# Patient Record
Sex: Female | Born: 1955 | Race: Black or African American | Hispanic: No | Smoking: Never smoker
Health system: Southern US, Community
[De-identification: ages and names within clinical notes are randomized; demographics above are authoritative.]

## PROBLEM LIST (undated history)

## (undated) DIAGNOSIS — R7989 Other specified abnormal findings of blood chemistry: Secondary | ICD-10-CM

## (undated) DIAGNOSIS — Z1159 Encounter for screening for other viral diseases: Secondary | ICD-10-CM

## (undated) DIAGNOSIS — M1990 Unspecified osteoarthritis, unspecified site: Secondary | ICD-10-CM

## (undated) DIAGNOSIS — E669 Obesity, unspecified: Secondary | ICD-10-CM

## (undated) DIAGNOSIS — L918 Other hypertrophic disorders of the skin: Secondary | ICD-10-CM

## (undated) DIAGNOSIS — E876 Hypokalemia: Secondary | ICD-10-CM

## (undated) DIAGNOSIS — I10 Essential (primary) hypertension: Secondary | ICD-10-CM

## (undated) DIAGNOSIS — R8290 Unspecified abnormal findings in urine: Secondary | ICD-10-CM

## (undated) HISTORY — DX: Other hypertrophic disorders of the skin: L91.8

## (undated) HISTORY — DX: Unspecified osteoarthritis, unspecified site: M19.90

## (undated) HISTORY — DX: Unspecified abnormal findings in urine: R82.90

## (undated) HISTORY — PX: PARTIAL HYSTERECTOMY: SHX80

## (undated) HISTORY — DX: Obesity, unspecified: E66.9

## (undated) HISTORY — DX: Hypokalemia: E87.6

## (undated) HISTORY — DX: Essential (primary) hypertension: I10

## (undated) HISTORY — PX: TUBAL LIGATION: SHX77

## (undated) HISTORY — DX: Other specified abnormal findings of blood chemistry: R79.89

## (undated) HISTORY — DX: Encounter for screening for other viral diseases: Z11.59

---

## 1999-04-29 ENCOUNTER — Other Ambulatory Visit: Admission: RE | Admit: 1999-04-29 | Discharge: 1999-04-29 | Payer: Self-pay | Admitting: Gynecology

## 2000-05-09 ENCOUNTER — Other Ambulatory Visit: Admission: RE | Admit: 2000-05-09 | Discharge: 2000-05-09 | Payer: Self-pay | Admitting: Gynecology

## 2000-11-27 ENCOUNTER — Ambulatory Visit (HOSPITAL_COMMUNITY): Admission: RE | Admit: 2000-11-27 | Discharge: 2000-11-27 | Payer: Self-pay | Admitting: Gynecology

## 2001-01-16 ENCOUNTER — Encounter (INDEPENDENT_AMBULATORY_CARE_PROVIDER_SITE_OTHER): Payer: Self-pay

## 2001-01-16 ENCOUNTER — Ambulatory Visit (HOSPITAL_COMMUNITY): Admission: RE | Admit: 2001-01-16 | Discharge: 2001-01-16 | Payer: Self-pay | Admitting: Gynecology

## 2009-01-22 ENCOUNTER — Ambulatory Visit: Payer: Self-pay | Admitting: Family Medicine

## 2009-01-22 DIAGNOSIS — I10 Essential (primary) hypertension: Secondary | ICD-10-CM | POA: Insufficient documentation

## 2009-01-26 LAB — CONVERTED CEMR LAB
ALT: 15 units/L (ref 0–35)
AST: 23 units/L (ref 0–37)
Basophils Relative: 0.6 % (ref 0.0–3.0)
CO2: 31 meq/L (ref 19–32)
Calcium: 9.2 mg/dL (ref 8.4–10.5)
Chloride: 102 meq/L (ref 96–112)
Eosinophils Relative: 3.7 % (ref 0.0–5.0)
GFR calc non Af Amer: 84.13 mL/min (ref >60)
HCT: 40.7 % (ref 36.0–46.0)
Hemoglobin: 13.7 g/dL (ref 12.0–15.0)
Lymphs Abs: 1.8 10*3/uL (ref 0.7–4.0)
MCV: 94.3 fL (ref 78.0–100.0)
Monocytes Relative: 8 % (ref 3.0–12.0)
Neutro Abs: 2.4 10*3/uL (ref 1.4–7.7)
RBC: 4.32 M/uL (ref 3.87–5.11)
Sodium: 140 meq/L (ref 135–145)
Total Bilirubin: 0.8 mg/dL (ref 0.3–1.2)
Total CHOL/HDL Ratio: 3
Total Protein: 7.4 g/dL (ref 6.0–8.3)
WBC: 4.8 10*3/uL (ref 4.5–10.5)

## 2009-02-23 ENCOUNTER — Ambulatory Visit: Payer: Self-pay | Admitting: Family Medicine

## 2009-02-25 ENCOUNTER — Encounter (INDEPENDENT_AMBULATORY_CARE_PROVIDER_SITE_OTHER): Payer: Self-pay | Admitting: *Deleted

## 2009-02-25 LAB — CONVERTED CEMR LAB
Calcium: 9.5 mg/dL (ref 8.4–10.5)
Creatinine, Ser: 1.2 mg/dL (ref 0.4–1.2)
Glucose, Bld: 86 mg/dL (ref 70–99)
Phosphorus: 3.5 mg/dL (ref 2.3–4.6)
Sodium: 141 meq/L (ref 135–145)

## 2009-02-27 ENCOUNTER — Ambulatory Visit: Payer: Self-pay | Admitting: Family Medicine

## 2009-02-27 ENCOUNTER — Encounter: Payer: Self-pay | Admitting: Family Medicine

## 2009-03-06 ENCOUNTER — Encounter (INDEPENDENT_AMBULATORY_CARE_PROVIDER_SITE_OTHER): Payer: Self-pay | Admitting: *Deleted

## 2009-03-06 LAB — HM MAMMOGRAPHY: HM Mammogram: NORMAL

## 2009-03-12 ENCOUNTER — Ambulatory Visit: Payer: Self-pay | Admitting: Family Medicine

## 2009-03-12 DIAGNOSIS — E876 Hypokalemia: Secondary | ICD-10-CM | POA: Insufficient documentation

## 2009-03-13 LAB — CONVERTED CEMR LAB: Potassium: 3.5 meq/L (ref 3.5–5.1)

## 2009-08-24 ENCOUNTER — Ambulatory Visit: Payer: Self-pay | Admitting: Family Medicine

## 2009-09-29 ENCOUNTER — Ambulatory Visit: Payer: Self-pay | Admitting: Family Medicine

## 2009-09-30 LAB — CONVERTED CEMR LAB
ALT: 23 units/L (ref 0–35)
AST: 27 units/L (ref 0–37)
Basophils Absolute: 0 10*3/uL (ref 0.0–0.1)
Calcium: 9.3 mg/dL (ref 8.4–10.5)
Glucose, Bld: 67 mg/dL — ABNORMAL LOW (ref 70–99)
LDL Cholesterol: 112 mg/dL — ABNORMAL HIGH (ref 0–99)
Lymphocytes Relative: 38.6 % (ref 12.0–46.0)
Monocytes Relative: 7 % (ref 3.0–12.0)
Phosphorus: 3.5 mg/dL (ref 2.3–4.6)
Platelets: 209 10*3/uL (ref 150.0–400.0)
Potassium: 3.6 meq/L (ref 3.5–5.1)
RDW: 13.6 % (ref 11.5–14.6)
Sodium: 143 meq/L (ref 135–145)
TSH: 2.57 microintl units/mL (ref 0.35–5.50)
VLDL: 12.4 mg/dL (ref 0.0–40.0)

## 2010-02-22 ENCOUNTER — Ambulatory Visit
Admission: RE | Admit: 2010-02-22 | Discharge: 2010-02-22 | Payer: Self-pay | Source: Home / Self Care | Attending: Family Medicine | Admitting: Family Medicine

## 2010-03-02 NOTE — Assessment & Plan Note (Signed)
Summary: 6 MONTH FOLLOW UP/RBH   Vital Signs:  Patient profile:   55 year old female Height:      63.25 inches Weight:      158.75 pounds BMI:     28.00 Temp:     98.9 degrees F oral Pulse rate:   84 / minute Pulse rhythm:   regular BP sitting:   168 / 94  (left arm) Cuff size:   regular  Vitals Entered By: Lewanda Rife LPN (August 24, 2009 9:30 AM) CC: six month f/u Comments Pt has been out of BP med for one week.   History of Present Illness: here for 6 mo f/u of HTN and hypokalemia   bp is 168/94- off med for one week (since she ran out) is normally on zestoretic   has been doing fine overall  feeling ok with no problems and taking good care of herself great diet and exercise   is eating more fiber too  started walking too - loves it   is out of K too     Allergies (verified): No Known Drug Allergies  Past History:  Past Surgical History: Last updated: 01/22/2009 partial hysterect - for cyst , has one ovary remaining (no hx of abn pap smears) BTL   Family History: Last updated: 01/22/2009 mother HTN  father asthma , copd -- cigars , lung cancer  sister with breast cancer  sister with DM - mild  ? heart problems in  distant family  some ? mental illness in family   Social History: Last updated: 01/22/2009 single  12 grade education G1p1 works at [employer] -- 3rd shift  never smoked  no alcohol  walks for exercise  cares for new grandchild   Past Medical History: HTN  hypokalemia   Review of Systems General:  Denies fatigue, loss of appetite, and malaise. Eyes:  Denies blurring and eye irritation. CV:  Denies chest pain or discomfort, lightheadness, palpitations, and shortness of breath with exertion. Resp:  Denies cough, shortness of breath, and wheezing. GI:  Denies abdominal pain, change in bowel habits, indigestion, and nausea. GU:  Denies urinary frequency. Derm:  Denies itching, lesion(s), poor wound healing, and rash. Neuro:   Denies headaches, numbness, and tingling. Endo:  Denies cold intolerance, excessive thirst, excessive urination, and heat intolerance.  Physical Exam  General:  Well-developed,well-nourished,in no acute distress; alert,appropriate and cooperative throughout examination Head:  normocephalic, atraumatic, and no abnormalities observed.   Eyes:  vision grossly intact, pupils equal, pupils round, and pupils reactive to light.  no conjunctival pallor, injection or icterus  Mouth:  pharynx pink and moist.   Neck:  supple with full rom and no masses or thyromegally, no JVD or carotid bruit  Chest Wall:  No deformities, masses, or tenderness noted. Lungs:  Normal respiratory effort, chest expands symmetrically. Lungs are clear to auscultation, no crackles or wheezes. Heart:  Normal rate and regular rhythm. S1 and S2 normal without gallop, murmur, click, rub or other extra sounds. Abdomen:  Bowel sounds positive,abdomen soft and non-tender without masses, organomegaly or hernias noted. no renal bruits  Msk:  No deformity or scoliosis noted of thoracic or lumbar spine.  no acute joint changes  Pulses:  R and L carotid,radial,femoral,dorsalis pedis and posterior tibial pulses are full and equal bilaterally Extremities:  No clubbing, cyanosis, edema, or deformity noted with normal full range of motion of all joints.   Neurologic:  sensation intact to light touch, gait normal, and DTRs symmetrical  and normal.   Skin:  Intact without suspicious lesions or rashes Cervical Nodes:  No lymphadenopathy noted Psych:  normal affect, talkative and pleasant    Impression & Recommendations:  Problem # 1:  HYPERTENSION, BENIGN ESSENTIAL (ICD-401.1) Assessment Deteriorated  this is up off med  refilled that-per pt was well controlled explained she can call in future if she runs out early  sched lab and nurse bp check in 1 mo  if all ok - will f/u with me in 6 mo  disc healthy diet (low simple sugar/ choose  complex carbs/ low sat fat) diet and exercise in detail  Her updated medication list for this problem includes:    Zestoretic 10-12.5 Mg Tabs (Lisinopril-hydrochlorothiazide) .Marland Kitchen... 1 by mouth once daily  BP today: 168/94 Prior BP: 120/78 (02/23/2009)  Labs Reviewed: K+: 3.5 (03/12/2009) Creat: : 1.2 (02/23/2009)   Chol: 167 (01/22/2009)   HDL: 66.50 (01/22/2009)   LDL: 84 (01/22/2009)   TG: 84.0 (01/22/2009)  Problem # 2:  HYPOKALEMIA (ICD-276.8) Assessment: Unchanged this imp with meds get back on low dose K - sent to pharmacy and check labs for HTN incl K in 1 mo  f/u 6 mo  disc high K foods  Complete Medication List: 1)  Zestoretic 10-12.5 Mg Tabs (Lisinopril-hydrochlorothiazide) .Marland Kitchen.. 1 by mouth once daily 2)  Potassium Chloride Cr 10 Meq Cr-caps (Potassium chloride) .... Take one by mouth daily 3)  Multivitamins Tabs (Multiple vitamin) .... Take 1 tablet by mouth once a day  Patient Instructions: 1)  schedule fasting labs in about a month lipid/ast/alt/renal /tsh/ cbc with diff 401.1- and please do nurse visit that day for blood pressure as well  2)  get back on your medicines - make sure to take them the day of your blood draw  3)  follow up with me in 6 months  Prescriptions: POTASSIUM CHLORIDE CR 10 MEQ CR-CAPS (POTASSIUM CHLORIDE) take one by mouth daily  #30 x 11   Entered and Authorized by:   Judith Part MD   Signed by:   Judith Part MD on 08/24/2009   Method used:   Electronically to        [facility] [address] (retail)       [address], [address]       [place], [place]  [address]       Ph: [phone]       Fax: [phone]   RxID:   [id number] ZESTORETIC 10-12.5 MG TABS (LISINOPRIL-HYDROCHLOROTHIAZIDE) 1 by mouth once daily  #30 x 11   Entered and Authorized by:   Judith Part MD   Signed by:   Judith Part MD on 08/24/2009   Method used:   Electronically to        [facility] [address] (retail)        [address], [address]       [place], [place]  [address]       Ph: [phone]       Fax: [phone]   RxID:   [id number]   Current Allergies (reviewed today): No known allergies

## 2010-03-02 NOTE — Assessment & Plan Note (Signed)
Summary: TOWER BP CHECK/RBH  Nurse Visit   Vital Signs:  Patient profile:   55 year old female Temp:     98.1 degrees F oral Pulse rate:   84 / minute Pulse rhythm:   regular BP sitting:   110 / 70  (right arm)  Vitals Entered By: Sydell Axon LPN (September 29, 2009 8:59 AM) CC: Patient here today for blood pressure check. Patient has been compliant with medications. No other complaints noted.   Allergies: No Known Drug Allergies  Orders Added: 1)  Est. Patient Level I [62703]  Current Allergies (reviewed today): No known allergies  blood pressure is good! she will f/u in 6 mo as planned

## 2010-03-02 NOTE — Miscellaneous (Signed)
Summary: mammogram results  Clinical Lists Changes  Observations: Added new observation of MAMMO DUE: 03/2010 (03/06/2009 9:17) Added new observation of MAMMOGRAM: normal (03/06/2009 9:17)      Preventive Care Screening  Mammogram:    Date:  03/06/2009    Next Due:  03/2010    Results:  normal

## 2010-03-02 NOTE — Assessment & Plan Note (Signed)
Summary: ONE MTH F/U FOR HIGH BLOOD PRESSURE / LFW   Vital Signs:  Patient profile:   55 year old female Weight:      157 pounds BMI:     27.69 Temp:     98 degrees F oral Pulse rate:   76 / minute Pulse rhythm:   regular BP sitting:   120 / 78  (left arm) Cuff size:   regular  Vitals Entered By: Lowella Petties CMA (February 23, 2009 9:22 AM) CC: One month follow up for BP   History of Present Illness: here for f/u of HTN  on zestoretic -- no side eff or problems at all   bp improved today at 120/78  wt is down 4 lb- is not eating any differently  is not a big eater , but is drinking more water-- this helps   K was 3.4 at last check- recommended high K foods  is eating a lot of bananas   Last Lipid ProfileCholesterol: 167 (01/22/2009 11:54:39 AM)HDL:  66.50 (01/22/2009 11:54:39 AM)LDL:  84 (01/22/2009 11:54:39 AM)Triglycerides:  Last Liver profileSGOT:  23 (01/22/2009 11:54:39 AM)SPGT:  15 (01/22/2009 11:54:39 AM)T. Bili:  0.8 (01/22/2009 11:54:39 AM)Alk Phos:  49 (01/22/2009 11:54:39 AM)  overall very good with high hdl and low LDL diet is good     Allergies: No Known Drug Allergies  Past History:  Past Medical History: Last updated: 01/22/2009 HTN   Past Surgical History: Last updated: 01/22/2009 partial hysterect - for cyst , has one ovary remaining (no hx of abn pap smears) BTL   Family History: Last updated: 01/22/2009 mother HTN  father asthma , copd -- cigars , lung cancer  sister with breast cancer  sister with DM - mild  ? heart problems in  distant family  some ? mental illness in family   Social History: Last updated: 01/22/2009 single  12 grade education G1p1 works at [employer] -- 3rd shift  never smoked  no alcohol  walks for exercise  cares for new grandchild   Review of Systems General:  Denies fatigue, loss of appetite, and malaise. Eyes:  Denies blurring and eye pain. CV:  Denies chest pain or discomfort,  lightheadness, palpitations, shortness of breath with exertion, and swelling of feet. Resp:  Denies cough and wheezing. GI:  Denies abdominal pain, change in bowel habits, and indigestion. GU:  Denies urinary frequency. MS:  Denies cramps and stiffness. Derm:  Denies lesion(s), poor wound healing, and rash. Neuro:  Denies numbness and tingling. Endo:  Denies cold intolerance, excessive thirst, excessive urination, and heat intolerance.  Physical Exam  General:  Well-developed,well-nourished,in no acute distress; alert,appropriate and cooperative throughout examination Head:  normocephalic, atraumatic, and no abnormalities observed.   Eyes:  vision grossly intact, pupils equal, pupils round, and pupils reactive to light.  no conjunctival pallor, injection or icterus  Mouth:  pharynx pink and moist.   Neck:  supple with full rom and no masses or thyromegally, no JVD or carotid bruit  Lungs:  Normal respiratory effort, chest expands symmetrically. Lungs are clear to auscultation, no crackles or wheezes. Heart:  Normal rate and regular rhythm. S1 and S2 normal without gallop, murmur, click, rub or other extra sounds. Extremities:  No clubbing, cyanosis, edema, or deformity noted with normal full range of motion of all joints.   Neurologic:  sensation intact to light touch, gait normal, and DTRs symmetrical and normal.   Skin:  Intact without suspicious lesions or rashes Cervical Nodes:  No lymphadenopathy noted Psych:  normal affect, talkative and pleasant    Impression & Recommendations:  Problem # 1:  HYPERTENSION, BENIGN ESSENTIAL (ICD-401.1) Assessment Improved  much imp with zestroetic - tolerating well disc healthy diet (low simple sugar/ choose complex carbs/ low sat fat) diet and exercise in detail -- also avoiding sodium  commended on good water intake lab for renal prof today- will suppl K if needed  f/u 6 mo  Her updated medication list for this problem includes:     Zestoretic 10-12.5 Mg Tabs (Lisinopril-hydrochlorothiazide) .Marland Kitchen... 1 by mouth once daily  Orders: Venipuncture (16109) TLB-Renal Function Panel (80069-RENAL)  BP today: 120/78 Prior BP: 150/98 (01/22/2009)  Labs Reviewed: K+: 3.4 (01/22/2009) Creat: : 0.9 (01/22/2009)   Chol: 167 (01/22/2009)   HDL: 66.50 (01/22/2009)   LDL: 84 (01/22/2009)   TG: 84.0 (01/22/2009)  Complete Medication List: 1)  Zestoretic 10-12.5 Mg Tabs (Lisinopril-hydrochlorothiazide) .Marland Kitchen.. 1 by mouth once daily  Patient Instructions: 1)  blood pressure is very good  2)  no change in medicine 3)  keep up good fruit and vegetable and water intake  4)  follow up in 6 months  5)  labs today Prescriptions: ZESTORETIC 10-12.5 MG TABS (LISINOPRIL-HYDROCHLOROTHIAZIDE) 1 by mouth once daily  #30 x 11   Entered and Authorized by:   Judith Part MD   Signed by:   Judith Part MD on 02/23/2009   Method used:   Print then Give to Patient   RxID:   [id number]   Prior Medications (reviewed today): ZESTORETIC 10-12.5 MG TABS (LISINOPRIL-HYDROCHLOROTHIAZIDE) 1 by mouth once daily Current Allergies: No known allergies

## 2010-03-02 NOTE — Miscellaneous (Signed)
Summary: med list update- potassium  Clinical Lists Changes  Medications: Added new medication of POTASSIUM CHLORIDE CR 10 MEQ CR-CAPS (POTASSIUM CHLORIDE) take one by mouth daily     Prior Medications: ZESTORETIC 10-12.5 MG TABS (LISINOPRIL-HYDROCHLOROTHIAZIDE) 1 by mouth once daily Current Allergies: No known allergies

## 2010-03-10 NOTE — Assessment & Plan Note (Signed)
Summary: 6 MONTH FOLLOW UP/RBH   Vital Signs:  Patient profile:   55 year old female Height:      63.25 inches Weight:      161.25 pounds BMI:     28.44 Temp:     98.5 degrees F oral Pulse rate:   76 / minute Pulse rhythm:   regular BP sitting:   144 / 86  (left arm) Cuff size:   regular  Vitals Entered By: Lewanda Rife LPN (February 22, 2010 9:09 AM)  Serial Vital Signs/Assessments:  Time      Position  BP       Pulse  Resp  Temp     By                     132/85                         Judith Part MD  CC: six month f/u   History of Present Illness: here for 6 mo f/u of HTN   wt is up 3lb with bmi of 28  first bp today is 144/86 did take med at 6:30 this am  has been feeling ok overall  is eating healthy and taking care of herself-- walks every day and loves it    on zestoretic - no problems or side eff   lipids up a bit Last Lipid ProfileCholesterol: 180 (09/29/2009 8:50:29 AM)HDL:  55.20 (09/29/2009 8:50:29 AM)LDL:  112 (09/29/2009 8:50:29 AM)Triglycerides:  Last Liver profileSGOT:  27 (09/29/2009 8:50:29 AM)SPGT:  23 (09/29/2009 8:50:29 AM)T. Bili:  0.8 (01/22/2009 11:54:39 AM)Alk Phos:  49 (01/22/2009 11:54:39 AM)  LDL used to be 84  eating has not changed - even for holidays  low fat diet    at work for [employer] -- bp was 118/72 at her physical there    Allergies (verified): No Known Drug Allergies  Past History:  Past Medical History: Last updated: 08/24/2009 HTN  hypokalemia   Past Surgical History: Last updated: 01/22/2009 partial hysterect - for cyst , has one ovary remaining (no hx of abn pap smears) BTL   Family History: Last updated: 01/22/2009 mother HTN  father asthma , copd -- cigars , lung cancer  sister with breast cancer  sister with DM - mild  ? heart problems in  distant family  some ? mental illness in family   Social History: Last updated: 01/22/2009 single  12 grade education G1p1 works at [employer] --  3rd shift  never smoked  no alcohol  walks for exercise  cares for new grandchild   Review of Systems General:  Denies fatigue, fever, loss of appetite, and malaise. Eyes:  Denies blurring and eye irritation. CV:  Denies chest pain or discomfort, palpitations, shortness of breath with exertion, and swelling of feet. Resp:  Denies cough and shortness of breath. GI:  Denies abdominal pain, bloody stools, change in bowel habits, and nausea. MS:  Denies joint pain, joint redness, joint swelling, muscle aches, and cramps. Derm:  Denies itching, lesion(s), poor wound healing, and rash. Neuro:  Denies numbness and tingling. Psych:  mood is good . Endo:  Denies cold intolerance, excessive thirst, excessive urination, and heat intolerance. Heme:  Denies abnormal bruising and bleeding.  Physical Exam  General:  Well-developed,well-nourished,in no acute distress; alert,appropriate and cooperative throughout examination Head:  normocephalic, atraumatic, and no abnormalities observed.   Eyes:  vision grossly intact, pupils equal, pupils round,  and pupils reactive to light.   Mouth:  pharynx pink and moist.   Neck:  supple with full rom and no masses or thyromegally, no JVD or carotid bruit  Lungs:  Normal respiratory effort, chest expands symmetrically. Lungs are clear to auscultation, no crackles or wheezes. Heart:  Normal rate and regular rhythm. S1 and S2 normal without gallop, murmur, click, rub or other extra sounds. Msk:  No deformity or scoliosis noted of thoracic or lumbar spine.  no acute joint changes  Pulses:  R and L carotid,radial,femoral,dorsalis pedis and posterior tibial pulses are full and equal bilaterally Extremities:  No clubbing, cyanosis, edema, or deformity noted with normal full range of motion of all joints.   Neurologic:  sensation intact to light touch, gait normal, and DTRs symmetrical and normal.   Skin:  Intact without suspicious lesions or rashes Cervical Nodes:  No  lymphadenopathy noted Psych:  normal affect, talkative and pleasant    Impression & Recommendations:  Problem # 1:  HYPERTENSION, BENIGN ESSENTIAL (ICD-401.1) Assessment Unchanged  doing well with no problems  re check is better enc good lifestyle habits  and continued exercise  Her updated medication list for this problem includes:    Zestoretic 10-12.5 Mg Tabs (Lisinopril-hydrochlorothiazide) .Marland Kitchen... 1 by mouth once daily  BP today: 144/86-- re check 132/85 Prior BP: 110/70 (09/29/2009)  Labs Reviewed: K+: 3.6 (09/29/2009) Creat: : 1.1 (09/29/2009)   Chol: 180 (09/29/2009)   HDL: 55.20 (09/29/2009)   LDL: 112 (09/29/2009)   TG: 62.0 (09/29/2009)  Complete Medication List: 1)  Zestoretic 10-12.5 Mg Tabs (Lisinopril-hydrochlorothiazide) .Marland Kitchen.. 1 by mouth once daily 2)  Potassium Chloride Cr 10 Meq Cr-caps (Potassium chloride) .... Take one by mouth daily 3)  Multivitamins Tabs (Multiple vitamin) .... Take 1 tablet by mouth once a day  Patient Instructions: 1)  f/u in 6 months for check up - labs prior  2)  no change in med    Orders Added: 1)  Est. Patient Level III [24401]    Current Allergies (reviewed today): No known allergies

## 2010-06-18 NOTE — H&P (Signed)
[facility]  Patient:    MARYUM, BATTERSON Visit Number: [id number] MRN: [id number]          Service Type: DSU Location: [facility] Attending Physician:  Merrily Pew Dictated by:   Nadyne Coombes Fontaine, M.D. Admit Date:  11/28/2000                           History and Physical  CHIEF COMPLAINT:              Increasing dysmenorrhea.  HISTORY OF PRESENT ILLNESS:   This is a 55 year old G2 P1 AB1 female, status post tubal sterilization, with history of worsening dysmenorrhea.  The patient was scheduled for admission in October 2002 for surgery and subsequently was cancelled due to elevated blood pressures in the preoperative holding area. She subsequently has been followed by Urgent Care medical and has now normalized blood pressures on medication, measuring 110/76.  She is admitted at this time for surgery.  Please see her October 2002 dictation for full History and Physical.  Correction to the dictation includes (otherwise, dictation is the same):  PAST MEDICAL HISTORY:         Hypertension.  CURRENT MEDICATIONS:          1. Toprol XL 50 mg q.d.                               2. Maxzide 25 mg q.d. Dictated by:   Nadyne Coombes. Fontaine, M.D. Attending Physician:  Merrily Pew DD:  01/15/01 TD:  01/16/01 Job: [phone] [id number]

## 2010-06-18 NOTE — H&P (Signed)
[facility]  Patient:    Kristin Barton, Kristin Barton Visit Number: [id number] MRN: [id number]          Service Type: Attending:  Nadyne Coombes. Fontaine, M.D. Dictated by:   Nadyne Coombes. Fontaine, M.D. Adm. Date:  11/08/00                           History and Physical  CHIEF COMPLAINT:              Increasing dysmenorrhea.  HISTORY OF PRESENT ILLNESS:   This is a 55 year old, G2, P1, Ab1, female, status post tubal sterilization who presents with worsening dysmenorrhea. The patients outpatient evaluation included an ultrasound which shows a complex cyst with inhomogeneous internal echoes measuring 3 cm on the left ovary which has persisted over a several month observation. She has a normal CA-125 and the preoperative diagnosis is endometriosis. The patient does have a history of laparoscopic tubal sterilization in 1996 at which time both adnexa were adhered to the posterior cul-de-sac with obliteration of the cul-de-sac, although no active endometriosis was visualized at that time.  PAST MEDICAL HISTORY:         Uncomplicated.  PAST SURGICAL HISTORY:        Cesarean section, TAB, laparoscopic tubal sterilization, laser vaporization of perineal condylomata.  ALLERGIES:                    No known drug allergies.  CURRENT MEDICATIONS:          None.  REVIEW OF SYSTEMS:            Noncontributory.  FAMILY HISTORY:               Noncontributory.  SOCIAL HISTORY:               Noncontributory.  ADMISSION PHYSICAL EXAMINATION:  VITAL SIGNS:                  Afebrile, vital signs are stable.  HEENT:                        Normal.  LUNGS:                        Clear.  CARDIAC:                      Regular rate without rubs, murmurs, or gallops.  ABDOMEN:                      Benign.  PELVIC:                       External BUS and vagina normal. Cervix normal. Uterus slightly irregular. Adnexa without masses or tenderness.  ASSESSMENT:                    38. A 55 year old G2, P38, Ab1 female, status post                                  laparoscopic tubal sterilization with                                  increasing dysmenorrhea.  2. Persistent complex cystic mass suggestive of                                  endometrioma, normal CA-125, for                                  laparoscopic evaluation.  DISCUSSION:                   I reviewed with the patient the situation and alternatives to include the options for expectant management given that the cystic area on her ovary has not changed over several month observation versus a laparoscopy. The patient understands given the adhesive disease that I realistically do not anticipate I am going to be able to free up her cul-de-sac or do major reconstructive surgery, but my goal is to assess the left ovary for an endometrioma and to assess for any other evidence of endometriosis or significant pathology that I can safely address. If I feel that during the procedure that I cannot safely address these areas due to significant adhesive disease, then will terminate the procedure. Alternatives to include hysterectomy were also reviewed with her and she feels most comfortable with a conservative laparoscopic approach. She understands the intraoperative, postoperative courses, multiple trocar placements, insufflation, sharp and blunt dissection, electrocautery, and the use of laser. The potential for inadvertent injury to internal organs, including bowel, bladder, ureters, vessels and nerves necessitating major exploratory reparative surgeries and future reparative surgeries, including ostomy formation, was all discussed, understood, and accepted. The risks of infection requiring prolonged antibiotics as well as opening and draining of the incisions or abscess formations was all discussed with her. The risks of major hemorrhage necessitating transfusion and the risk of  transfusion including HIV were all discussed, understood, and accepted. No guarantees were made as far as pain relief is concerned. She understands that her pain may persist, worsen, or change after the procedure, although it certainly may improve and we will have to wait and see following the procedure. The patient is comfortable with the plan. Her questions were answered and she is ready to proceed with surgery. Dictated by:   Nadyne Coombes. Fontaine, M.D. Attending:  Nadyne Coombes. Fontaine, M.D. DD:  11/27/00 TD:  11/27/00 Job: [id number]

## 2010-06-18 NOTE — Op Note (Signed)
[facility]  Patient:    Kristin Barton, Kristin Barton Visit Number: [id number] MRN: [id number]          Service Type: DSU Location: [facility] Attending Physician:  Merrily Pew Dictated by:   Nadyne Coombes Fontaine, M.D. Proc. Date: 01/16/01 Admit Date:  01/16/2001                             Operative Report  PREOPERATIVE DIAGNOSES:       1. Pain.                               2. Left ovarian cyst.                               3. Pelvic adhesive disease.  POSTOPERATIVE DIAGNOSES:      1. Pain.                               2. Left ovarian cyst.                               3. Pelvic adhesive disease.  PROCEDURE:                    Laparoscopic left ovarian cystectomy.  SURGEON:                      Timothy P. Fontaine, M.D.  ANESTHESIA:                   General endotracheal.  COMPLICATIONS:                None.  ESTIMATED BLOOD LOSS:         Minimal.  SPECIMENS:                    Left ovarian cyst wall.  FINDINGS:                     Anterior cul-de-sac normal.  Posterior cul-de-sac obliterated by both ovaries adherent into the posterior cul-de-sac. Uterus grossly normal in size.  Right ovary grossly normal, adherent to the cul-de-sac.  Left ovary:  A 2 cm simple cyst, excised.  Ovary otherwise adherent to the cul-de-sac.  Proximal fallopian tube segments approximately 0.5 cm bilaterally consistent with prior tubal sterilization.  Distal segments not visualized.  No active endometriosis visualized.  Appendix grossly normal. Liver:  Smooth.  No abnormalities.  Gallbladder not visualized.  PROCEDURE:                    Patient was taken to the operating room. Underwent general endotracheal anesthesia.  Was placed in the low dorsal lithotomy position.  Received an abdominal, vaginal, perineum preparation with Betadine solution.  Bladder emptied with in-and-out Foley catheterization and EUA was performed.  A Hulka tenaculum was placed on the  cervix and the patient was draped in the usual fashion.  Infraumbilical transverse incision was made through a prior incision, the Veress needle placed, its position verified with water and approximately 2 L of carbon dioxide gas was infused without difficulty.  A 10 mm laparoscopic trocar was then placed without difficulty, its position  verified visually.  A left of midline suprapubic 5 mm port was then placed without difficulty under direct visualization after transillumination for the vessels.  Examination of the pelvic organs and upper abdominal examination was then carried out with findings noted above.  The left ovarian cyst was then incised.  Clear fluid extruded.  The cyst capsule was pulled free from the ovarian stroma.  This was sent to pathology.  The ovarian stroma was then bipolar cauterized which achieved ultimate hemostasis. The pelvis was copiously irrigated which showed adequate hemostasis.  The suprapubic port was then removed, gas allowed to escape.  All areas inspected under a low pressure situation showing adequate hemostasis and the infraumbilical port was then backed out under direct visualization showing adequate hemostasis and no evidence of hernia formation.  A 0 Vicryl interrupted subcutaneous fascial stitch was placed infraumbilically.  Both skin incisions were closed using Dermabond skin adhesive.  Both skin incisions were also injected using 0.25% Marcaine, a total of 3-4 cc.  Patient was then placed in the supine position after removal of the Hulka tenaculum.  Was awakened without difficulty and was taken to the recovery room in good condition having tolerated procedure well. Dictated by:   Nadyne Coombes. Fontaine, M.D. Attending Physician:  Merrily Pew DD:  01/16/01 TD:  01/16/01 Job: [id number]

## 2010-08-13 ENCOUNTER — Encounter: Payer: Self-pay | Admitting: Family Medicine

## 2010-08-17 ENCOUNTER — Telehealth: Payer: Self-pay | Admitting: Family Medicine

## 2010-08-17 DIAGNOSIS — Z0000 Encounter for general adult medical examination without abnormal findings: Secondary | ICD-10-CM | POA: Insufficient documentation

## 2010-08-17 DIAGNOSIS — E876 Hypokalemia: Secondary | ICD-10-CM

## 2010-08-17 DIAGNOSIS — I10 Essential (primary) hypertension: Secondary | ICD-10-CM

## 2010-08-17 NOTE — Telephone Encounter (Signed)
Message copied by Judy Pimple on Tue Aug 17, 2010 10:27 PM ------      Message from: Baldomero Lamy      Created: Mon Aug 16, 2010 11:58 AM      Regarding: Cpx Labs Wed       Please order  future cpx labs for pt's upcomming lab appt.      Thanks      Rodney Booze

## 2010-08-18 ENCOUNTER — Other Ambulatory Visit (INDEPENDENT_AMBULATORY_CARE_PROVIDER_SITE_OTHER): Payer: BC Managed Care – PPO | Admitting: Family Medicine

## 2010-08-18 DIAGNOSIS — E876 Hypokalemia: Secondary | ICD-10-CM

## 2010-08-18 DIAGNOSIS — Z0000 Encounter for general adult medical examination without abnormal findings: Secondary | ICD-10-CM

## 2010-08-18 DIAGNOSIS — I10 Essential (primary) hypertension: Secondary | ICD-10-CM

## 2010-08-18 LAB — CBC WITH DIFFERENTIAL/PLATELET
Basophils Absolute: 0 10*3/uL (ref 0.0–0.1)
Eosinophils Absolute: 0.2 10*3/uL (ref 0.0–0.7)
Lymphocytes Relative: 37.5 % (ref 12.0–46.0)
Lymphs Abs: 1.7 10*3/uL (ref 0.7–4.0)
Monocytes Relative: 7.6 % (ref 3.0–12.0)
Platelets: 220 10*3/uL (ref 150.0–400.0)
RDW: 13.8 % (ref 11.5–14.6)

## 2010-08-18 LAB — LIPID PANEL
HDL: 61.1 mg/dL (ref >39.00)
LDL Cholesterol: 91 mg/dL (ref 0–99)
Total CHOL/HDL Ratio: 3

## 2010-08-18 LAB — COMPREHENSIVE METABOLIC PANEL
ALT: 22 U/L (ref 0–35)
AST: 28 U/L (ref 0–37)
CO2: 31 mEq/L (ref 19–32)
Chloride: 106 mEq/L (ref 96–112)
GFR: 77.63 mL/min (ref >60.00)
Sodium: 143 mEq/L (ref 135–145)
Total Bilirubin: 0.5 mg/dL (ref 0.3–1.2)
Total Protein: 8.2 g/dL (ref 6.0–8.3)

## 2010-08-23 ENCOUNTER — Ambulatory Visit (INDEPENDENT_AMBULATORY_CARE_PROVIDER_SITE_OTHER): Payer: BC Managed Care – PPO | Admitting: Family Medicine

## 2010-08-23 ENCOUNTER — Encounter: Payer: Self-pay | Admitting: Family Medicine

## 2010-08-23 VITALS — BP 120/76 | HR 80 | Temp 98.2°F | Ht 63.0 in | Wt 172.5 lb

## 2010-08-23 DIAGNOSIS — Z1231 Encounter for screening mammogram for malignant neoplasm of breast: Secondary | ICD-10-CM

## 2010-08-23 DIAGNOSIS — Z0000 Encounter for general adult medical examination without abnormal findings: Secondary | ICD-10-CM

## 2010-08-23 DIAGNOSIS — I10 Essential (primary) hypertension: Secondary | ICD-10-CM

## 2010-08-23 DIAGNOSIS — Z23 Encounter for immunization: Secondary | ICD-10-CM

## 2010-08-23 DIAGNOSIS — E876 Hypokalemia: Secondary | ICD-10-CM

## 2010-08-23 DIAGNOSIS — Z1211 Encounter for screening for malignant neoplasm of colon: Secondary | ICD-10-CM | POA: Insufficient documentation

## 2010-08-23 MED ORDER — POTASSIUM CHLORIDE ER 10 MEQ PO CPCR: 20.0000 meq | ORAL_CAPSULE | Freq: Every day | ORAL | Status: DC

## 2010-08-23 MED ORDER — LISINOPRIL-HYDROCHLOROTHIAZIDE 10-12.5 MG PO TABS: 1.0000 | ORAL_TABLET | Freq: Every day | ORAL | Status: DC

## 2010-08-23 NOTE — Assessment & Plan Note (Signed)
sched first screen mam In addn slt anemia - may or may not be related

## 2010-08-23 NOTE — Assessment & Plan Note (Signed)
Well controlled with ace-hct Refilled Lab reviewed  Disc healthy lifestyle for HTN

## 2010-08-23 NOTE — Assessment & Plan Note (Signed)
Nl breast exam  Reminded to do self exams  sched mam  Sister had breast cancer

## 2010-08-23 NOTE — Assessment & Plan Note (Addendum)
Reviewed health habits including diet and exercise and skin cancer prevention Also reviewed health mt list, fam hx and immunizations  Rev wellness labs in detail sched mam and colonosc  Also Tdap today slt anemia - post men

## 2010-08-23 NOTE — Assessment & Plan Note (Signed)
K is 3.4 (likely due to diuretic )  Pt having cramping Inc dose of KCL to daily  Re check K in 2 weeks and update if no imp in cramping

## 2010-08-23 NOTE — Progress Notes (Signed)
Subjective:    Patient ID: Kristin Barton, female    DOB: 1955-05-02, 55 y.o.   MRN: [id number]  HPI Here for health mt exam and also to review chronic med problems  Feeling good and no new med problems   Wt is up 11 lb  Has had partial hyst in past with no abn paps No symptoms at all   Mam 2/11-- at [facility] - needs to schedule No changes on self exam  Sister with hx of breast ca   Td-due for - will do the tdap today   colonosc  - is interested in that   Mild anemia  No colon cancer in family   HTN in good control wth 120/76  k low this check at 3.4 Is on K supplement- no missed doses at all - is taking regularly  She still gets cramps in hands and leg   Good chol with LDL 91 Lab Results  Component Value Date   CHOL 170 08/18/2010   CHOL 180 09/29/2009   CHOL 167 01/22/2009   Lab Results  Component Value Date   HDL 61.10 08/18/2010   HDL 40.98 09/29/2009   HDL 11.91 01/22/2009   Lab Results  Component Value Date   LDLCALC 91 08/18/2010   LDLCALC 112* 09/29/2009   LDLCALC 84 01/22/2009   Lab Results  Component Value Date   TRIG 88.0 08/18/2010   TRIG 62.0 09/29/2009   TRIG 84.0 01/22/2009   Lab Results  Component Value Date   CHOLHDL 3 08/18/2010   CHOLHDL 3 09/29/2009   CHOLHDL 3 01/22/2009   No results found for this basename: LDLDIRECT     Hb is 11.8- down a bit   Patient Active Problem List  Diagnoses  . HYPOKALEMIA  . HYPERTENSION, BENIGN ESSENTIAL  . Routine general medical examination at a health care facility  . Other screening mammogram  . Special screening for malignant neoplasms, colon   Past Medical History  Diagnosis Date  . Hypertension   . Hypokalemia    Past Surgical History  Procedure Date  . Partial hysterectomy     for cyst, has one ovary remaining (no hx of abn pap smears)   History  Substance Use Topics  . Smoking status: Never Smoker   . Smokeless tobacco: Not on file  . Alcohol Use: No   Family History  Problem  Relation Age of Onset  . Hypertension Mother   . Asthma Father   . COPD Father     cigars  . Lung cancer Father   . Breast cancer Sister   . Diabetes Sister     mild  . Other      ? heart problems- distant family  . Mental illness Other     ? in family   No Known Allergies Current Outpatient Prescriptions on File Prior to Visit  Medication Sig Dispense Refill  . Multiple Vitamin (MULTIVITAMIN) tablet Take 1 tablet by mouth daily.             Review of Systems Review of Systems  Constitutional: Negative for fever, appetite change, fatigue and unexpected weight change.  Eyes: Negative for pain and visual disturbance.  Respiratory: Negative for cough and shortness of breath.   Cardiovascular: Negative.  for cp or sob or palpitations Gastrointestinal: Negative for nausea, diarrhea and constipation.  Genitourinary: Negative for urgency and frequency.  Skin: Negative for pallor. no rash  Neurological: Negative for weakness, light-headedness, numbness and headaches.  Hematological: Negative for adenopathy.  Does not bruise/bleed easily.  Psychiatric/Behavioral: Negative for dysphoric mood. The patient is not nervous/anxious.          Objective:   Physical Exam  Constitutional: She appears well-developed and well-nourished. No distress.       overwt and well appearing   HENT:  Head: Normocephalic and atraumatic.  Left Ear: External ear normal.  Nose: Nose normal.  Mouth/Throat: Oropharynx is clear and moist.  Eyes: Conjunctivae and EOM are normal. Pupils are equal, round, and reactive to light.  Neck: Normal range of motion. Neck supple. No JVD present. Carotid bruit is not present. No thyromegaly present.  Cardiovascular: Normal rate, regular rhythm, normal heart sounds and intact distal pulses.   No murmur heard. Pulmonary/Chest: Effort normal and breath sounds normal. No respiratory distress. She has no wheezes.  Abdominal: Soft. Bowel sounds are normal. She exhibits no  distension, no abdominal bruit and no mass. There is no tenderness.  Genitourinary: No breast swelling, tenderness, discharge or bleeding.  Musculoskeletal: Normal range of motion. She exhibits no edema and no tenderness.  Lymphadenopathy:    She has no cervical adenopathy.  Neurological: She is alert. She has normal reflexes. No cranial nerve deficit. Coordination normal.  Skin: Skin is warm and dry. No rash noted. No erythema. No pallor.  Psychiatric: She has a normal mood and affect.          Assessment & Plan:

## 2010-08-23 NOTE — Patient Instructions (Signed)
We will refer you for mammogram and colonoscopy at check out  Keep working on healthy diet and exercise  Increase the potassium to 20 meq daily  Check labs for potassium in 2 weeks

## 2010-08-30 ENCOUNTER — Other Ambulatory Visit (INDEPENDENT_AMBULATORY_CARE_PROVIDER_SITE_OTHER): Payer: BC Managed Care – PPO | Admitting: Family Medicine

## 2010-08-30 DIAGNOSIS — E876 Hypokalemia: Secondary | ICD-10-CM

## 2010-08-30 LAB — POTASSIUM: Potassium: 3.4 mEq/L — ABNORMAL LOW (ref 3.5–5.1)

## 2010-09-03 ENCOUNTER — Telehealth: Payer: Self-pay

## 2010-09-03 NOTE — Telephone Encounter (Signed)
Left message with pt's daughter for pt to call back.

## 2010-09-03 NOTE — Telephone Encounter (Signed)
Message copied by Patience Musca on Fri Sep 03, 2010  5:36 PM ------      Message from: Roxy Manns A      Created: Thu Sep 02, 2010  5:13 PM       K level is the same      Did she increase her K to 20 meq?

## 2010-09-06 ENCOUNTER — Other Ambulatory Visit: Payer: BC Managed Care – PPO

## 2010-09-06 ENCOUNTER — Telehealth: Payer: Self-pay | Admitting: *Deleted

## 2010-09-06 NOTE — Telephone Encounter (Signed)
Pt had returned my call earlier when I was not available. I tried to call pt back and left v/m for pt to call back.

## 2010-09-06 NOTE — Telephone Encounter (Signed)
Please see 09/03/10 phone note.

## 2010-09-07 MED ORDER — POTASSIUM CHLORIDE ER 10 MEQ PO CPCR: ORAL_CAPSULE | ORAL | Status: DC

## 2010-09-07 NOTE — Telephone Encounter (Signed)
Left v/m at only contact # for pt to call back. Medication phoned to [facility] [address] pharmacy as instructed.

## 2010-09-07 NOTE — Telephone Encounter (Signed)
Patient notified as instructed by telephone. Pt has been taking K 20 meq daily.

## 2010-09-07 NOTE — Telephone Encounter (Signed)
Patient notified.  Lab appt scheduled.

## 2010-09-07 NOTE — Telephone Encounter (Signed)
Go ahead and increase that to 3 pills once daily (for total of 30 meq) daily and re check K in 2 weeks please I will change px on list for call in -thanks

## 2010-09-21 ENCOUNTER — Other Ambulatory Visit: Payer: BC Managed Care – PPO

## 2010-10-18 ENCOUNTER — Other Ambulatory Visit: Payer: BC Managed Care – PPO | Admitting: Gastroenterology

## 2013-05-08 ENCOUNTER — Ambulatory Visit: Payer: Self-pay | Admitting: Medical

## 2013-05-16 ENCOUNTER — Ambulatory Visit: Payer: Self-pay | Admitting: Medical

## 2013-05-17 ENCOUNTER — Ambulatory Visit (INDEPENDENT_AMBULATORY_CARE_PROVIDER_SITE_OTHER): Payer: BC Managed Care – PPO | Admitting: Family Medicine

## 2013-05-17 ENCOUNTER — Encounter: Payer: Self-pay | Admitting: Family Medicine

## 2013-05-17 VITALS — BP 156/98 | HR 90 | Temp 98.4°F | Ht 63.0 in | Wt 169.5 lb

## 2013-05-17 DIAGNOSIS — I10 Essential (primary) hypertension: Secondary | ICD-10-CM

## 2013-05-17 LAB — CBC WITH DIFFERENTIAL/PLATELET
BASOS ABS: 0 10*3/uL (ref 0.0–0.1)
BASOS PCT: 0.6 % (ref 0.0–3.0)
EOS ABS: 0.2 10*3/uL (ref 0.0–0.7)
Eosinophils Relative: 3.9 % (ref 0.0–5.0)
HCT: 34.5 % — ABNORMAL LOW (ref 36.0–46.0)
Hemoglobin: 11.5 g/dL — ABNORMAL LOW (ref 12.0–15.0)
LYMPHS PCT: 35.4 % (ref 12.0–46.0)
Lymphs Abs: 1.9 10*3/uL (ref 0.7–4.0)
MCHC: 33.3 g/dL (ref 30.0–36.0)
MCV: 92.2 fl (ref 78.0–100.0)
MONO ABS: 0.5 10*3/uL (ref 0.1–1.0)
Monocytes Relative: 8.8 % (ref 3.0–12.0)
NEUTROS ABS: 2.7 10*3/uL (ref 1.4–7.7)
NEUTROS PCT: 51.3 % (ref 43.0–77.0)
Platelets: 229 10*3/uL (ref 150.0–400.0)
RBC: 3.74 Mil/uL — AB (ref 3.87–5.11)
RDW: 14.2 % (ref 11.5–14.6)
WBC: 5.2 10*3/uL (ref 4.5–10.5)

## 2013-05-17 LAB — COMPREHENSIVE METABOLIC PANEL
ALK PHOS: 52 U/L (ref 39–117)
ALT: 17 U/L (ref 0–35)
AST: 27 U/L (ref 0–37)
Albumin: 3.9 g/dL (ref 3.5–5.2)
BUN: 20 mg/dL (ref 6–23)
CHLORIDE: 107 meq/L (ref 96–112)
CO2: 30 mEq/L (ref 19–32)
CREATININE: 0.9 mg/dL (ref 0.4–1.2)
Calcium: 9.5 mg/dL (ref 8.4–10.5)
GFR: 83.88 mL/min (ref >60.00)
Glucose, Bld: 81 mg/dL (ref 70–99)
POTASSIUM: 3.9 meq/L (ref 3.5–5.1)
Sodium: 143 mEq/L (ref 135–145)
Total Bilirubin: 0.5 mg/dL (ref 0.3–1.2)
Total Protein: 7.4 g/dL (ref 6.0–8.3)

## 2013-05-17 LAB — LIPID PANEL
CHOL/HDL RATIO: 3
CHOLESTEROL: 167 mg/dL (ref 0–200)
HDL: 63.7 mg/dL (ref >39.00)
LDL CALC: 84 mg/dL (ref 0–99)
TRIGLYCERIDES: 97 mg/dL (ref 0.0–149.0)
VLDL: 19.4 mg/dL (ref 0.0–40.0)

## 2013-05-17 LAB — TSH: TSH: 1.76 u[IU]/mL (ref 0.35–5.50)

## 2013-05-17 MED ORDER — LISINOPRIL-HYDROCHLOROTHIAZIDE 10-12.5 MG PO TABS: 1.0000 | ORAL_TABLET | Freq: Every day | ORAL | Status: DC

## 2013-05-17 NOTE — Progress Notes (Signed)
Pre visit review using our clinic review tool, if applicable. No additional management support is needed unless otherwise documented below in the visit note. 

## 2013-05-17 NOTE — Patient Instructions (Signed)
Labs today  Start back on your blood pressure medicine  Follow up in 3-4 weeks for a 30 min appt -to re check blood pressure and review labs and also remove the mole on your neck   Take care of yourself    DASH Diet The DASH diet stands for "Dietary Approaches to Stop Hypertension." It is a healthy eating plan that has been shown to reduce high blood pressure (hypertension) in as little as 14 days, while also possibly providing other significant health benefits. These other health benefits include reducing the risk of breast cancer after menopause and reducing the risk of type 2 diabetes, heart disease, colon cancer, and stroke. Health benefits also include weight loss and slowing kidney failure in patients with chronic kidney disease.  DIET GUIDELINES  Limit salt (sodium). Your diet should contain less than 1500 mg of sodium daily.  Limit refined or processed carbohydrates. Your diet should include mostly whole grains. Desserts and added sugars should be used sparingly.  Include small amounts of heart-healthy fats. These types of fats include nuts, oils, and tub margarine. Limit saturated and trans fats. These fats have been shown to be harmful in the body. CHOOSING FOODS  The following food groups are based on a 2000 calorie diet. See your Registered Dietitian for individual calorie needs. Grains and Grain Products (6 to 8 servings daily)  Eat More Often: Whole-wheat bread, brown rice, whole-grain or wheat pasta, quinoa, popcorn without added fat or salt (air popped).  Eat Less Often: White bread, white pasta, white rice, cornbread. Vegetables (4 to 5 servings daily)  Eat More Often: Fresh, frozen, and canned vegetables. Vegetables may be raw, steamed, roasted, or grilled with a minimal amount of fat.  Eat Less Often/Avoid: Creamed or fried vegetables. Vegetables in a cheese sauce. Fruit (4 to 5 servings daily)  Eat More Often: All fresh, canned (in natural juice), or frozen fruits.  Dried fruits without added sugar. One hundred percent fruit juice ( cup [237 mL] daily).  Eat Less Often: Dried fruits with added sugar. Canned fruit in light or heavy syrup. Foot LockerLean Meats, Fish, and Poultry (2 servings or less daily. One serving is 3 to 4 oz [85-114 g]).  Eat More Often: Ninety percent or leaner ground beef, tenderloin, sirloin. Round cuts of beef, chicken breast, [place]key breast. All fish. Grill, bake, or broil your meat. Nothing should be fried.  Eat Less Often/Avoid: Fatty cuts of meat, [place]key, or chicken leg, thigh, or wing. Fried cuts of meat or fish. Dairy (2 to 3 servings)  Eat More Often: Low-fat or fat-free milk, low-fat plain or light yogurt, reduced-fat or part-skim cheese.  Eat Less Often/Avoid: Milk (whole, 2%).Whole milk yogurt. Full-fat cheeses. Nuts, Seeds, and Legumes (4 to 5 servings per week)  Eat More Often: All without added salt.  Eat Less Often/Avoid: Salted nuts and seeds, canned beans with added salt. Fats and Sweets (limited)  Eat More Often: Vegetable oils, tub margarines without trans fats, sugar-free gelatin. Mayonnaise and salad dressings.  Eat Less Often/Avoid: Coconut oils, palm oils, butter, stick margarine, cream, half and half, cookies, candy, pie. FOR MORE INFORMATION The Dash Diet Eating Plan: www.dashdiet.org Document Released: 01/06/2011 Document Revised: 04/11/2011 Document Reviewed: 01/06/2011 [facility] Patient Information 2014 [place], [place].

## 2013-05-17 NOTE — Progress Notes (Signed)
Subjective:    Patient ID: Kristin Barton, female    DOB: 07-22-1955, 58 y.o.   MRN: [id number]  HPI Here for HTN  Out of her lisinopril hct - it works well while she is on it and now bp is back up off the med  Diet and exercise is fair bp is up today BP Readings from Last 3 Encounters:  05/17/13 156/98  08/23/10 120/76  02/22/10 144/86      Overdue for labs   She is taking care of herself  Taking care of grandkids    Has a mole on neck driving her crazy- wants to return to get it removed    Patient Active Problem List   Diagnosis Date Noted  . Other screening mammogram 08/23/2010  . Special screening for malignant neoplasms, colon 08/23/2010  . Routine general medical examination at a health care facility 08/17/2010  . HYPOKALEMIA 03/12/2009  . HYPERTENSION, BENIGN ESSENTIAL 01/22/2009   Past Medical History  Diagnosis Date  . Hypertension   . Hypokalemia    Past Surgical History  Procedure Laterality Date  . Partial hysterectomy      for cyst, has one ovary remaining (no hx of abn pap smears)   History  Substance Use Topics  . Smoking status: Never Smoker   . Smokeless tobacco: Not on file  . Alcohol Use: No   Family History  Problem Relation Age of Onset  . Hypertension Mother   . Asthma Father   . COPD Father     cigars  . Lung cancer Father   . Breast cancer Sister   . Diabetes Sister     mild  . Other      ? heart problems- distant family  . Mental illness Other     ? in family   No Known Allergies Current Outpatient Prescriptions on File Prior to Visit  Medication Sig Dispense Refill  . lisinopril-hydrochlorothiazide (PRINZIDE,ZESTORETIC) 10-12.5 MG per tablet Take 1 tablet by mouth daily.  30 tablet  11  . Multiple Vitamin (MULTIVITAMIN) tablet Take 1 tablet by mouth daily.         No current facility-administered medications on file prior to visit.      Review of Systems Review of Systems  Constitutional: Negative for fever,  appetite change, fatigue and unexpected weight change.  Eyes: Negative for pain and visual disturbance.  Respiratory: Negative for cough and shortness of breath.   Cardiovascular: Negative for cp or palpitations    Gastrointestinal: Negative for nausea, diarrhea and constipation.  Genitourinary: Negative for urgency and frequency.  Skin: Negative for pallor or rash  pos for bothersome skin tag on neck Neurological: Negative for weakness, light-headedness, numbness and headaches.  Hematological: Negative for adenopathy. Does not bruise/bleed easily.  Psychiatric/Behavioral: Negative for dysphoric mood. The patient is not nervous/anxious.         Objective:   Physical Exam  Constitutional: She appears well-developed and well-nourished. No distress.  HENT:  Head: Normocephalic and atraumatic.  Mouth/Throat: Oropharynx is clear and moist.  Eyes: Conjunctivae and EOM are normal. Pupils are equal, round, and reactive to light.  Neck: Normal range of motion. Neck supple. No JVD present. Carotid bruit is not present. No thyromegaly present.  Cardiovascular: Normal rate, regular rhythm, normal heart sounds and intact distal pulses.  Exam reveals no gallop.   Pulmonary/Chest: Effort normal and breath sounds normal. No respiratory distress. She has no wheezes. She has no rales.  Abdominal: Soft. Bowel sounds are normal.  She exhibits no distension and no abdominal bruit. There is no tenderness. There is no rebound.  Musculoskeletal: She exhibits no edema.  Lymphadenopathy:    She has no cervical adenopathy.  Neurological: She is alert. She has normal reflexes.  Skin: Skin is warm and dry. No rash noted. No erythema. No pallor.  3-4 mm skin tag on R neck that is abraded  Psychiatric: She has a normal mood and affect.          Assessment & Plan:

## 2013-05-19 NOTE — Assessment & Plan Note (Signed)
Out of control off medication Will start back on lisinopril -hct  No symptoms  Rev lifestyle habits Handout given on DASH diet  F/u planned for re check of bp  Also lab today

## 2013-05-20 ENCOUNTER — Telehealth: Payer: Self-pay | Admitting: Family Medicine

## 2013-05-20 ENCOUNTER — Encounter: Payer: Self-pay | Admitting: *Deleted

## 2013-05-20 NOTE — Telephone Encounter (Signed)
emmi mailed to patient °

## 2013-06-14 ENCOUNTER — Ambulatory Visit (INDEPENDENT_AMBULATORY_CARE_PROVIDER_SITE_OTHER): Payer: BC Managed Care – PPO | Admitting: Family Medicine

## 2013-06-14 ENCOUNTER — Encounter: Payer: Self-pay | Admitting: Family Medicine

## 2013-06-14 VITALS — BP 126/78 | HR 75 | Temp 98.1°F | Ht 63.0 in | Wt 170.8 lb

## 2013-06-14 DIAGNOSIS — I10 Essential (primary) hypertension: Secondary | ICD-10-CM

## 2013-06-14 DIAGNOSIS — L919 Hypertrophic disorder of the skin, unspecified: Secondary | ICD-10-CM

## 2013-06-14 DIAGNOSIS — L089 Local infection of the skin and subcutaneous tissue, unspecified: Secondary | ICD-10-CM

## 2013-06-14 DIAGNOSIS — L918 Other hypertrophic disorders of the skin: Secondary | ICD-10-CM

## 2013-06-14 DIAGNOSIS — L909 Atrophic disorder of skin, unspecified: Secondary | ICD-10-CM

## 2013-06-14 HISTORY — DX: Other hypertrophic disorders of the skin: L91.8

## 2013-06-14 LAB — BASIC METABOLIC PANEL
BUN: 18 mg/dL (ref 6–23)
CO2: 29 mEq/L (ref 19–32)
Calcium: 9.3 mg/dL (ref 8.4–10.5)
Chloride: 104 mEq/L (ref 96–112)
Creatinine, Ser: 1.1 mg/dL (ref 0.4–1.2)
GFR: 67.8 mL/min (ref >60.00)
GLUCOSE: 74 mg/dL (ref 70–99)
POTASSIUM: 3.4 meq/L — AB (ref 3.5–5.1)
Sodium: 143 mEq/L (ref 135–145)

## 2013-06-14 NOTE — Progress Notes (Signed)
   Subjective:    Patient ID: Kristin Barton, female    DOB: November 26, 1955, 58 y.o.   MRN: [id number]  HPI Here for f/u of HTN  bp is stable today  No cp or palpitations or headaches or edema  No side effects to medicines  BP Readings from Last 3 Encounters:  06/14/13 126/78  05/17/13 156/98  08/23/10 120/76     Much improved with lisinopril hct     Chemistry      Component Value Date/Time   NA 143 05/17/2013 1316   K 3.9 05/17/2013 1316   CL 107 05/17/2013 1316   CO2 30 05/17/2013 1316   BUN 20 05/17/2013 1316   CREATININE 0.9 05/17/2013 1316      Component Value Date/Time   CALCIUM 9.5 05/17/2013 1316   ALKPHOS 52 05/17/2013 1316   AST 27 05/17/2013 1316   ALT 17 05/17/2013 1316   BILITOT 0.5 05/17/2013 1316       Lab Results  Component Value Date   WBC 5.2 05/17/2013   HGB 11.5* 05/17/2013   HCT 34.5* 05/17/2013   MCV 92.2 05/17/2013   PLT 229.0 05/17/2013   has always been slt anemic   Needs a skin tag removed from R side of neck today that has been there for years-now getting caught on her hair and irritated    Review of Systems Review of Systems  Constitutional: Negative for fever, appetite change, fatigue and unexpected weight change.  Eyes: Negative for pain and visual disturbance.  Respiratory: Negative for cough and shortness of breath.   Cardiovascular: Negative for cp or palpitations    Gastrointestinal: Negative for nausea, diarrhea and constipation.  Genitourinary: Negative for urgency and frequency.  Skin: Negative for pallor or rash  pos for irritated skin tag on neck Neurological: Negative for weakness, light-headedness, numbness and headaches.  Hematological: Negative for adenopathy. Does not bruise/bleed easily.  Psychiatric/Behavioral: Negative for dysphoric mood. The patient is not nervous/anxious.         Objective:   Physical Exam  Constitutional: She appears well-developed and well-nourished. No distress.  obese and well appearing   HENT:    Head: Normocephalic and atraumatic.  Mouth/Throat: Oropharynx is clear and moist.  Eyes: Conjunctivae and EOM are normal. Pupils are equal, round, and reactive to light.  Neck: Normal range of motion. Neck supple.  Cardiovascular: Normal rate and regular rhythm.   Pulmonary/Chest: Effort normal and breath sounds normal. No respiratory distress. She has no wheezes. She has no rales.  No crackles   Musculoskeletal: She exhibits no edema.  Lymphadenopathy:    She has no cervical adenopathy.  Neurological: She is alert. She has normal reflexes.  Skin: Skin is warm and dry. No rash noted.  4 mm skin tag - brown in color and irritated/abraded on R side of neck   Psychiatric: She has a normal mood and affect.          Assessment & Plan:

## 2013-06-14 NOTE — Patient Instructions (Signed)
Keep the skin tag removal site clean with soap and water  If any redness/ pain/ drainage let me know  For allergies- allegra or claritin or zyrtec are fine over the counter  Blood pressure is good  Lab today  Follow up in about 6 months

## 2013-06-14 NOTE — Progress Notes (Signed)
Pre visit review using our clinic review tool, if applicable. No additional management support is needed unless otherwise documented below in the visit note. 

## 2013-06-16 NOTE — Assessment & Plan Note (Signed)
bp in fair control at this time  BP Readings from Last 1 Encounters:  06/14/13 126/78   No changes needed Disc lifstyle change with low sodium diet and exercise   Much imp with lisinopril hct  Bmp today

## 2013-06-16 NOTE — Assessment & Plan Note (Signed)
Area anes with 2% xylocaine w/o epi .5 cc  Prepped/draped in sterile fashion Base clipped with scissors  Hemostasis with silver nitrate stick Pt tolerated well  Disc aftercare in detail  Will update if any s/s of infx or other problems

## 2013-06-18 ENCOUNTER — Encounter: Payer: Self-pay | Admitting: *Deleted

## 2013-06-18 ENCOUNTER — Telehealth: Payer: Self-pay | Admitting: *Deleted

## 2013-06-18 MED ORDER — POTASSIUM CHLORIDE ER 10 MEQ PO TBCR: 10.0000 meq | EXTENDED_RELEASE_TABLET | Freq: Every day | ORAL | Status: DC

## 2013-06-18 NOTE — Telephone Encounter (Signed)
Called # on file and phone was off so mailed letter letting pt know labs and Dr. Royden Purlower's comments and Rx sent to pharmacy

## 2013-06-18 NOTE — Telephone Encounter (Signed)
Message copied by Shon MilletWATLINGTON, Nunzio Banet M on Tue Jun 18, 2013  4:22 PM ------      Message from: Roxy MannsWER, MARNE A      Created: Sun Jun 16, 2013  7:49 PM       Very slightly low K - please call in K dur 10 meq 1 po qd # 30 11 ref and add to med list        ------

## 2013-07-02 ENCOUNTER — Emergency Department: Payer: Self-pay | Admitting: Emergency Medicine

## 2013-07-02 LAB — BASIC METABOLIC PANEL
Anion Gap: 5 — ABNORMAL LOW (ref 7–16)
BUN: 24 mg/dL — AB (ref 7–18)
CHLORIDE: 105 mmol/L (ref 98–107)
CREATININE: 1.07 mg/dL (ref 0.60–1.30)
Calcium, Total: 9.5 mg/dL (ref 8.5–10.1)
Co2: 29 mmol/L (ref 21–32)
GFR CALC NON AF AMER: 58 — AB
Glucose: 96 mg/dL (ref 65–99)
OSMOLALITY: 281 (ref 275–301)
POTASSIUM: 3.8 mmol/L (ref 3.5–5.1)
SODIUM: 139 mmol/L (ref 136–145)

## 2013-07-02 LAB — URINALYSIS, COMPLETE
BILIRUBIN, UR: NEGATIVE
Glucose,UR: NEGATIVE mg/dL (ref 0–75)
Ketone: NEGATIVE
NITRITE: NEGATIVE
Ph: 6 (ref 4.5–8.0)
Protein: 30
SPECIFIC GRAVITY: 1.021 (ref 1.003–1.030)
WBC UR: 201 /HPF (ref 0–5)

## 2013-07-02 LAB — CBC
HCT: 37.4 % (ref 35.0–47.0)
HGB: 12.5 g/dL (ref 12.0–16.0)
MCH: 30.6 pg (ref 26.0–34.0)
MCHC: 33.3 g/dL (ref 32.0–36.0)
MCV: 92 fL (ref 80–100)
Platelet: 187 10*3/uL (ref 150–440)
RBC: 4.07 10*6/uL (ref 3.80–5.20)
RDW: 13.5 % (ref 11.5–14.5)
WBC: 5 10*3/uL (ref 3.6–11.0)

## 2013-07-05 ENCOUNTER — Encounter: Payer: Self-pay | Admitting: Family Medicine

## 2013-07-05 ENCOUNTER — Ambulatory Visit (INDEPENDENT_AMBULATORY_CARE_PROVIDER_SITE_OTHER): Payer: BC Managed Care – PPO | Admitting: Family Medicine

## 2013-07-05 VITALS — BP 120/80 | HR 84 | Temp 98.6°F | Wt 171.0 lb

## 2013-07-05 DIAGNOSIS — R252 Cramp and spasm: Secondary | ICD-10-CM

## 2013-07-05 NOTE — Progress Notes (Signed)
Subjective:    Patient ID: Kristin Barton, female    DOB: [birth year], 58 y.o.   MRN: [id number]  HPI Pt went to ER on Tues - for severe R leg pain with a bad cramp (does were drawn)- and it just would not go away- tightened up so bad she cried and could not walk or put weight on it  This scared her and she went to [facility]  Per pt they gave her a pain shot and also muscle relaxer shot -in her hip Labs were fine-no low k  Gave nsaid called ketorolac--px Gave orphenadrine - for muscle spasm--px She has not taken either med   Now her leg feels fine- she had improvement after her shot  Was able to walk out   She has not had any new exercise or activity She just takes a nl walk every day - nothing has changed  Takes lisinopril hct    Out of work until sunday  Patient Active Problem List   Diagnosis Date Noted  . Inflamed skin tag 06/14/2013  . Other screening mammogram 08/23/2010  . Special screening for malignant neoplasms, colon 08/23/2010  . Routine general medical examination at a health care facility 08/17/2010  . HYPOKALEMIA 03/12/2009  . HYPERTENSION, BENIGN ESSENTIAL 01/22/2009   Past Medical History  Diagnosis Date  . Hypertension   . Hypokalemia    Past Surgical History  Procedure Laterality Date  . Partial hysterectomy      for cyst, has one ovary remaining (no hx of abn pap smears)   History  Substance Use Topics  . Smoking status: Never Smoker   . Smokeless tobacco: Not on file  . Alcohol Use: No   Family History  Problem Relation Age of Onset  . Hypertension Mother   . Asthma Father   . COPD Father     cigars  . Lung cancer Father   . Breast cancer Sister   . Diabetes Sister     mild  . Other      ? heart problems- distant family  . Mental illness Other     ? in family   No Known Allergies Current Outpatient Prescriptions on File Prior to Visit  Medication Sig Dispense Refill  . lisinopril-hydrochlorothiazide (PRINZIDE,ZESTORETIC) 10-12.5 MG  per tablet Take 1 tablet by mouth daily.  90 tablet  3  . Multiple Vitamin (MULTIVITAMIN) tablet Take 1 tablet by mouth daily.        . potassium chloride (K-DUR) 10 MEQ tablet Take 1 tablet (10 mEq total) by mouth daily.  30 tablet  11   No current facility-administered medications on file prior to visit.    Review of Systems Review of Systems  Constitutional: Negative for fever, appetite change, fatigue and unexpected weight change.  Eyes: Negative for pain and visual disturbance.  Respiratory: Negative for cough and shortness of breath.   Cardiovascular: Negative for cp or palpitations    Gastrointestinal: Negative for nausea, diarrhea and constipation.  Genitourinary: Negative for urgency and frequency.  Skin: Negative for pallor or rash   MSK pos for leg cramp that is now totally resolved  Neurological: Negative for weakness, light-headedness, numbness and headaches.  Hematological: Negative for adenopathy. Does not bruise/bleed easily.  Psychiatric/Behavioral: Negative for dysphoric mood. The patient is not nervous/anxious.         Objective:   Physical Exam  Constitutional: She appears well-developed and well-nourished. No distress.  HENT:  Head: Normocephalic and atraumatic.  Mouth/Throat: Oropharynx is clear  and moist.  Eyes: Conjunctivae and EOM are normal. Pupils are equal, round, and reactive to light. No scleral icterus.  Cardiovascular: Normal rate, regular rhythm and intact distal pulses.  Exam reveals no gallop.   Musculoskeletal: She exhibits no edema and no tenderness.  R leg-no swelling/redness/tenderness or warmth Nl pedal pulses Neg Homan sign  Nl rom knee/hip/ankle/foot/toes  Nl gait     Lymphadenopathy:    She has no cervical adenopathy.  Neurological: She is alert. She has normal reflexes. No cranial nerve deficit. She exhibits normal muscle tone. Coordination normal.  Skin: Skin is warm and dry. No rash noted. No erythema.  Psychiatric: She has a  normal mood and affect.          Assessment & Plan:   Problem List Items Addressed This Visit     Other   Leg cramp - Primary     [facility] for severe R leg cramp on tues (I do not have any records) Her K there she states was in nl range  Given nsaid and muscle relaxer- with quick relief and no symptoms since Adv pt not to take the nsaid or muscle relaxer given unless sympt returned Disc walking and stretching / good hydration / continuance of K supplement  Disc some home remedies for leg cramps Reassuring exam- she will call if no imp

## 2013-07-05 NOTE — Patient Instructions (Signed)
I'm glad you are feeling better and the cramp has stopped You do not need to take the medicines from the ER unless cramp returns  Stretch every day and try to continue your regular walking  Some people note that tonic water - 4 oz per day may help prevent cramps Some people note that a teaspoon of yellow mustard daily helps also   Update me if symptoms keep coming back  Please send for records from [facility] ER

## 2013-07-05 NOTE — Progress Notes (Signed)
Pre visit review using our clinic review tool, if applicable. No additional management support is needed unless otherwise documented below in the visit note. 

## 2013-07-05 NOTE — Assessment & Plan Note (Signed)
Pt was seen in [facility] ER for severe R leg cramp on tues (I do not have any records) Her K there she states was in nl range  Given nsaid and muscle relaxer- with quick relief and no symptoms since Adv pt not to take the nsaid or muscle relaxer given unless sympt returned Disc walking and stretching / good hydration / continuance of K supplement  Disc some home remedies for leg cramps Reassuring exam- she will call if no imp

## 2013-11-07 ENCOUNTER — Ambulatory Visit: Payer: Self-pay | Admitting: Gynecology

## 2013-11-15 ENCOUNTER — Emergency Department: Payer: Self-pay | Admitting: Emergency Medicine

## 2013-11-15 LAB — BASIC METABOLIC PANEL
Anion Gap: 9 (ref 7–16)
BUN: 14 mg/dL (ref 7–18)
CALCIUM: 9 mg/dL (ref 8.5–10.1)
CO2: 28 mmol/L (ref 21–32)
CREATININE: 0.96 mg/dL (ref 0.60–1.30)
Chloride: 105 mmol/L (ref 98–107)
EGFR (Non-African Amer.): >60
GLUCOSE: 103 mg/dL — AB (ref 65–99)
Osmolality: 284 (ref 275–301)
Potassium: 3.4 mmol/L — ABNORMAL LOW (ref 3.5–5.1)
Sodium: 142 mmol/L (ref 136–145)

## 2013-11-15 LAB — CBC
HCT: 36.6 % (ref 35.0–47.0)
HGB: 11.7 g/dL — ABNORMAL LOW (ref 12.0–16.0)
MCH: 30 pg (ref 26.0–34.0)
MCHC: 32.1 g/dL (ref 32.0–36.0)
MCV: 93 fL (ref 80–100)
Platelet: 211 10*3/uL (ref 150–440)
RBC: 3.92 10*6/uL (ref 3.80–5.20)
RDW: 13.4 % (ref 11.5–14.5)
WBC: 6.3 10*3/uL (ref 3.6–11.0)

## 2013-11-22 ENCOUNTER — Encounter: Payer: Self-pay | Admitting: Family Medicine

## 2013-11-22 ENCOUNTER — Ambulatory Visit (INDEPENDENT_AMBULATORY_CARE_PROVIDER_SITE_OTHER): Payer: BC Managed Care – PPO | Admitting: Family Medicine

## 2013-11-22 VITALS — BP 132/92 | HR 74 | Temp 98.7°F | Ht 63.0 in | Wt 176.5 lb

## 2013-11-22 DIAGNOSIS — Z87898 Personal history of other specified conditions: Secondary | ICD-10-CM | POA: Insufficient documentation

## 2013-11-22 DIAGNOSIS — R55 Syncope and collapse: Secondary | ICD-10-CM

## 2013-11-22 DIAGNOSIS — R252 Cramp and spasm: Secondary | ICD-10-CM

## 2013-11-22 DIAGNOSIS — E876 Hypokalemia: Secondary | ICD-10-CM

## 2013-11-22 MED ORDER — POTASSIUM CHLORIDE ER 10 MEQ PO TBCR: 10.0000 meq | EXTENDED_RELEASE_TABLET | Freq: Two times a day (BID) | ORAL | Status: DC

## 2013-11-22 NOTE — Patient Instructions (Signed)
Increase potassium to twice daily  If any problems let me know  If your leg pain does not improve- let me know  Drink lots of fluids   (8-12 servings of water every day)  We reviewed ER records- very reassuring -but if you faint again please let us know right away

## 2013-11-22 NOTE — Progress Notes (Signed)
Subjective:    Patient ID: Kristin Barton, female    DOB: [birth year], 58 y.o.   MRN: [id number]  HPI Here for f/u of ER visit 10/16  She had a normal day- had a doctors appt and then went to hosp to see niece's baby  A busy day  In a crowded hospital room - she stool next to the window She passed out  (had not seen blood or anything else)  immed care - bp was in the 80s - and the got very nauseated and vomited  No seizure activity  Her head did hit the floor   Has never fainted before   Had not skipped a meal  Had drank enough fluids   Went to the ER  Had labs and EKG and CT of the head  All came back normal  Did get fluids-thought to be dehydrated   K was 3.4  No missed doses  Also mvi and vit D  Patient Active Problem List   Diagnosis Date Noted  . Syncope 11/22/2013  . Leg cramp 07/05/2013  . Inflamed skin tag 06/14/2013  . Other screening mammogram 08/23/2010  . Special screening for malignant neoplasms, colon 08/23/2010  . Routine general medical examination at a health care facility 08/17/2010  . HYPOKALEMIA 03/12/2009  . HYPERTENSION, BENIGN ESSENTIAL 01/22/2009   Past Medical History  Diagnosis Date  . Hypertension   . Hypokalemia    Past Surgical History  Procedure Laterality Date  . Partial hysterectomy      for cyst, has one ovary remaining (no hx of abn pap smears)   History  Substance Use Topics  . Smoking status: Never Smoker   . Smokeless tobacco: Not on file  . Alcohol Use: No   Family History  Problem Relation Age of Onset  . Hypertension Mother   . Asthma Father   . COPD Father     cigars  . Lung cancer Father   . Breast cancer Sister   . Diabetes Sister     mild  . Other      ? heart problems- distant family  . Mental illness Other     ? in family   No Known Allergies Current Outpatient Prescriptions on File Prior to Visit  Medication Sig Dispense Refill  . lisinopril-hydrochlorothiazide (PRINZIDE,ZESTORETIC) 10-12.5 MG  per tablet Take 1 tablet by mouth daily.  90 tablet  3  . Multiple Vitamin (MULTIVITAMIN) tablet Take 1 tablet by mouth daily.         No current facility-administered medications on file prior to visit.    Review of Systems Review of Systems  Constitutional: Negative for fever, appetite change, fatigue and unexpected weight change.  Eyes: Negative for pain and visual disturbance.  Respiratory: Negative for cough and shortness of breath.   Cardiovascular: Negative for cp or palpitations    Gastrointestinal: Negative for nausea, diarrhea and constipation.  Genitourinary: Negative for urgency and frequency.  Skin: Negative for pallor or rash   MSK pos for chronic leg pain and cramping  Neurological: Negative for weakness, light-headedness, numbness and headaches. pos for one episode of syncope  Hematological: Negative for adenopathy. Does not bruise/bleed easily.  Psychiatric/Behavioral: Negative for dysphoric mood. The patient is not nervous/anxious.         Objective:   Physical Exam  Constitutional: She appears well-developed and well-nourished. No distress.  obese and well appearing   HENT:  Head: Normocephalic and atraumatic.  Right Ear: External ear normal.  Left  Ear: External ear normal.  Nose: Nose normal.  Mouth/Throat: Oropharynx is clear and moist.  Eyes: Conjunctivae and EOM are normal. Pupils are equal, round, and reactive to light. Right eye exhibits no discharge. Left eye exhibits no discharge. No scleral icterus.  No nystagmus  Neck: Normal range of motion. Neck supple. No JVD present. No thyromegaly present.  Cardiovascular: Normal rate, regular rhythm, normal heart sounds and intact distal pulses.  Exam reveals no gallop.   Pulmonary/Chest: Effort normal and breath sounds normal. No respiratory distress. She has no wheezes. She has no rales.  Abdominal: Soft. Bowel sounds are normal. She exhibits no distension and no mass. There is no tenderness.  Musculoskeletal:  She exhibits no edema and no tenderness.  No leg tenderness today No edema   Lymphadenopathy:    She has no cervical adenopathy.  Neurological: She is alert. She has normal reflexes. She displays no atrophy and no tremor. No cranial nerve deficit or sensory deficit. She exhibits normal muscle tone. She displays a negative Romberg sign. Coordination and gait normal.  No cerebellar signs   Skin: Skin is warm and dry. No rash noted. No erythema. No pallor.  Psychiatric: She has a normal mood and affect.          Assessment & Plan:   Problem List Items Addressed This Visit     Cardiovascular and Mediastinum   Syncope - Primary     One episode after standing in a warm crowded hospital room  Rev ER records/ lab/studies-all reassuring  Strongly suspect vasovagal episode Disc what this is and how to prevent it and what to do if she feels faint inst to seek care/update us if symptoms return  Disc imp of good hydration  >25 minutes spent in face to face time with patient, >50% spent in counselling or coordination of care       Other   HYPOKALEMIA     This was mild at ER - but persistently slt low  Will inc her K dose to 20 meq daily Then re check  Hope this helps her leg symptoms     Leg cramp     Will titrate her K up and see if this helps Disc stretching/exercise as well

## 2013-11-22 NOTE — Progress Notes (Signed)
Pre visit review using our clinic review tool, if applicable. No additional management support is needed unless otherwise documented below in the visit note. 

## 2013-11-24 NOTE — Assessment & Plan Note (Signed)
Will titrate her K up and see if this helps Disc stretching/exercise as well

## 2013-11-24 NOTE — Assessment & Plan Note (Signed)
This was mild at ER - but persistently slt low  Will inc her K dose to 20 meq daily Then re check  Hope this helps her leg symptoms

## 2013-11-24 NOTE — Assessment & Plan Note (Signed)
One episode after standing in a warm crowded hospital room  Rev ER records/ lab/studies-all reassuring  Strongly suspect vasovagal episode Disc what this is and how to prevent it and what to do if she feels faint inst to seek care/update us if symptoms return  Disc imp of good hydration  >25 minutes spent in face to face time with patient, >50% spent in counselling or coordination of care

## 2013-11-28 ENCOUNTER — Ambulatory Visit: Payer: Self-pay | Admitting: General Surgery

## 2013-12-02 ENCOUNTER — Encounter: Payer: Self-pay | Admitting: Family Medicine

## 2013-12-16 ENCOUNTER — Ambulatory Visit: Payer: BC Managed Care – PPO | Admitting: Family Medicine

## 2013-12-17 ENCOUNTER — Ambulatory Visit (INDEPENDENT_AMBULATORY_CARE_PROVIDER_SITE_OTHER): Payer: BC Managed Care – PPO | Admitting: Family Medicine

## 2013-12-17 ENCOUNTER — Encounter: Payer: Self-pay | Admitting: Family Medicine

## 2013-12-17 VITALS — BP 136/84 | HR 74 | Temp 98.6°F | Ht 63.0 in | Wt 175.5 lb

## 2013-12-17 DIAGNOSIS — E876 Hypokalemia: Secondary | ICD-10-CM

## 2013-12-17 DIAGNOSIS — I10 Essential (primary) hypertension: Secondary | ICD-10-CM

## 2013-12-17 MED ORDER — POTASSIUM CHLORIDE ER 10 MEQ PO TBCR: 10.0000 meq | EXTENDED_RELEASE_TABLET | Freq: Two times a day (BID) | ORAL | Status: DC

## 2013-12-17 NOTE — Progress Notes (Signed)
Subjective:    Patient ID: Kristin Barton, female    DOB: [birth year], 58 y.o.   MRN: [id number]  HPI Here for f/u of chronic medical problems   Has colonoscopy upcoming to get that done   Wt is down 1 lb with bmi of 31  bp is stable today  No cp or palpitations or headaches or edema  No side effects to medicines  BP Readings from Last 3 Encounters:  12/17/13 136/84  11/22/13 132/92  07/05/13 120/80      Hypokalemia  Is on total 20 meq daily now  [facility] -reassuring  Her cramps are better than she was - she still gets them a bit in her legs  She walks every day - and keeps moving at work also   Also D level of 48  Flu vaccine - she "never takes them"   Had a mammogram in April   Is menopausal - had fsh/lh and estrogen drawn  Had pap last mo - called back to repeat it - ? paps have been abn in the past   No more syncopal episodes   Patient Active Problem List   Diagnosis Date Noted  . Syncope 11/22/2013  . Leg cramp 07/05/2013  . Inflamed skin tag 06/14/2013  . Other screening mammogram 08/23/2010  . Special screening for malignant neoplasms, colon 08/23/2010  . Routine general medical examination at a health care facility 08/17/2010  . HYPOKALEMIA 03/12/2009  . HYPERTENSION, BENIGN ESSENTIAL 01/22/2009   Past Medical History  Diagnosis Date  . Hypertension   . Hypokalemia    Past Surgical History  Procedure Laterality Date  . Partial hysterectomy      for cyst, has one ovary remaining (no hx of abn pap smears)   History  Substance Use Topics  . Smoking status: Never Smoker   . Smokeless tobacco: Not on file  . Alcohol Use: No   Family History  Problem Relation Age of Onset  . Hypertension Mother   . Asthma Father   . COPD Father     cigars  . Lung cancer Father   . Breast cancer Sister   . Diabetes Sister     mild  . Other      ? heart problems- distant family  . Mental illness Other     ? in family   No Known  Allergies Current Outpatient Prescriptions on File Prior to Visit  Medication Sig Dispense Refill  . lisinopril-hydrochlorothiazide (PRINZIDE,ZESTORETIC) 10-12.5 MG per tablet Take 1 tablet by mouth daily. 90 tablet 3  . Multiple Vitamin (MULTIVITAMIN) tablet Take 1 tablet by mouth daily.       No current facility-administered medications on file prior to visit.    Review of Systems Review of Systems  Constitutional: Negative for fever, appetite change, fatigue and unexpected weight change.  Eyes: Negative for pain and visual disturbance.  Respiratory: Negative for cough and shortness of breath.   Cardiovascular: Negative for cp or palpitations    Gastrointestinal: Negative for nausea, diarrhea and constipation.  Genitourinary: Negative for urgency and frequency.  Skin: Negative for pallor or rash   Neurological: Negative for weakness, light-headedness, numbness and headaches.  Hematological: Negative for adenopathy. Does not bruise/bleed easily.  Psychiatric/Behavioral: Negative for dysphoric mood. The patient is not nervous/anxious.         Objective:   Physical Exam  Constitutional: She appears well-developed and well-nourished. No distress.  obese and well appearing   HENT:  Head: Normocephalic and atraumatic.  Mouth/Throat: Oropharynx is clear and moist.  Eyes: Conjunctivae and EOM are normal. Pupils are equal, round, and reactive to light. No scleral icterus.  Neck: Normal range of motion. Neck supple. No JVD present. Carotid bruit is not present. No thyromegaly present.  Cardiovascular: Normal rate, regular rhythm, normal heart sounds and intact distal pulses.  Exam reveals no gallop.   Pulmonary/Chest: Effort normal and breath sounds normal. No respiratory distress. She has no wheezes. She has no rales.  No crackles   Abdominal: Soft. Bowel sounds are normal. She exhibits no abdominal bruit.  Musculoskeletal: She exhibits no edema.  Lymphadenopathy:    She has no  cervical adenopathy.  Neurological: She is alert. She has normal reflexes.  Skin: Skin is warm and dry. No pallor.  Psychiatric: She has a normal mood and affect.          Assessment & Plan:   Problem List Items Addressed This Visit      Cardiovascular and Mediastinum   HYPERTENSION, BENIGN ESSENTIAL - Primary    bp in fair control at this time  BP Readings from Last 1 Encounters:  12/17/13 136/84   No changes needed Disc lifstyle change with low sodium diet and exercise  No more syncope  Will continue current med     Relevant Orders      Basic metabolic panel     Other   HYPOKALEMIA    [facility] 3.[facility]-nl with 20 meq daily  Cramps in legs are improved Continue this dose F/u 6 mo with lab prior        Relevant Orders      Basic metabolic panel

## 2013-12-17 NOTE — Assessment & Plan Note (Signed)
bp in fair control at this time  BP Readings from Last 1 Encounters:  12/17/13 136/84   No changes needed Disc lifstyle change with low sodium diet and exercise  No more syncope  Will continue current med

## 2013-12-17 NOTE — Progress Notes (Signed)
Pre visit review using our clinic review tool, if applicable. No additional management support is needed unless otherwise documented below in the visit note. 

## 2013-12-17 NOTE — Assessment & Plan Note (Signed)
[facility]nl with 20 meq daily  Cramps in legs are improved Continue this dose F/u 6 mo with lab prior

## 2013-12-17 NOTE — Patient Instructions (Addendum)
Continue current medicines  Do not miss your potassium doses Blood pressure is stable  Take care of yourself   Omega 3 (found in fish oil or flax seed oil)- may help raise your "good" cholesterol   Follow up in about 6 months with labs prior

## 2013-12-19 ENCOUNTER — Encounter: Payer: Self-pay | Admitting: General Surgery

## 2013-12-19 ENCOUNTER — Ambulatory Visit (INDEPENDENT_AMBULATORY_CARE_PROVIDER_SITE_OTHER): Payer: BC Managed Care – PPO | Admitting: General Surgery

## 2013-12-19 VITALS — BP 124/78 | HR 88 | Resp 14 | Ht 63.0 in | Wt 171.0 lb

## 2013-12-19 DIAGNOSIS — Z1211 Encounter for screening for malignant neoplasm of colon: Secondary | ICD-10-CM

## 2013-12-19 MED ORDER — POLYETHYLENE GLYCOL 3350 17 GM/SCOOP PO POWD: ORAL | Status: DC

## 2013-12-19 NOTE — Patient Instructions (Addendum)
Colonoscopy A colonoscopy is an exam to look at the entire large intestine (colon). This exam can help find problems such as tumors, polyps, inflammation, and areas of bleeding. The exam takes about 1 hour.  LET [facility] KNOW ABOUT:   Any allergies you have.  All medicines you are taking, including vitamins, herbs, eye drops, creams, and over-the-counter medicines.  Previous problems you or members of your family have had with the use of anesthetics.  Any blood disorders you have.  Previous surgeries you have had.  Medical conditions you have. RISKS AND COMPLICATIONS  Generally, this is a safe procedure. However, as with any procedure, complications can occur. Possible complications include:  Bleeding.  Tearing or rupture of the colon wall.  Reaction to medicines given during the exam.  Infection (rare). BEFORE THE PROCEDURE   Ask your health care provider about changing or stopping your regular medicines.  You may be prescribed an oral bowel prep. This involves drinking a large amount of medicated liquid, starting the day before your procedure. The liquid will cause you to have multiple loose stools until your stool is almost clear or light green. This cleans out your colon in preparation for the procedure.  Do not eat or drink anything else once you have started the bowel prep, unless your health care provider tells you it is safe to do so.  Arrange for someone to drive you home after the procedure. PROCEDURE   You will be given medicine to help you relax (sedative).  You will lie on your side with your knees bent.  A long, flexible tube with a light and camera on the end (colonoscope) will be inserted through the rectum and into the colon. The camera sends video back to a computer screen as it moves through the colon. The colonoscope also releases carbon dioxide gas to inflate the colon. This helps your health care provider see the area better.  During  the exam, your health care provider may take a small tissue sample (biopsy) to be examined under a microscope if any abnormalities are found.  The exam is finished when the entire colon has been viewed. AFTER THE PROCEDURE   Do not drive for 24 hours after the exam.  You may have a small amount of blood in your stool.  You may pass moderate amounts of gas and have mild abdominal cramping or bloating. This is caused by the gas used to inflate your colon during the exam.  Ask when your test results will be ready and how you will get your results. Make sure you get your test results. Document Released: 01/15/2000 Document Revised: 11/07/2012 Document Reviewed: 09/24/2012 [facility] Patient Information 2015 [employer] [place]. This information is not intended to replace advice given to you by your health care provider. Make sure you discuss any questions you have with your health care provider.  Patient [facility]

## 2013-12-19 NOTE — Progress Notes (Signed)
Patient ID: Kristin Barton, female   DOB: [birth year], 58 y.o.   MRN: [id number]  Chief Complaint  Patient presents with  . Colonoscopy    HPI Kristin Junesyvone Witthuhn is a 58 y.o. female here today for a evaluation of a screening colonoscopy . Patient states she is doing well with no GI problems. Bowels move regular and daily, no blood. She states she has been seeing Dr Luella Cookosenow and has recently had an abnormal pap smear.  HPI  Past Medical History  Diagnosis Date  . Hypertension   . Hypokalemia     Past Surgical History  Procedure Laterality Date  . Partial hysterectomy      for cyst, has one ovary remaining (no hx of abn pap smears)  . Cesarean section  1987  . Tubal ligation      Family History  Problem Relation Age of Onset  . Hypertension Mother   . Asthma Father   . COPD Father     cigars  . Lung cancer Father   . Breast cancer Sister   . Diabetes Sister     mild  . Other      ? heart problems- distant family  . Mental illness Other     ? in family    Social History History  Substance Use Topics  . Smoking status: Never Smoker   . Smokeless tobacco: Not on file  . Alcohol Use: No    No Known Allergies  Current Outpatient Prescriptions  Medication Sig Dispense Refill  . Cholecalciferol (VITAMIN D3) 2000 UNITS CHEW Chew 2 tablets by mouth daily.    Marland Kitchen. lisinopril-hydrochlorothiazide (PRINZIDE,ZESTORETIC) 10-12.5 MG per tablet Take 1 tablet by mouth daily. 90 tablet 3  . Multiple Vitamin (MULTIVITAMIN) tablet Take 1 tablet by mouth daily.      . naproxen sodium (ANAPROX) 220 MG tablet Take 220 mg by mouth 2 (two) times daily with a meal.    . potassium chloride (K-DUR) 10 MEQ tablet Take 1 tablet (10 mEq total) by mouth 2 (two) times daily. 90 tablet 3  . polyethylene glycol powder (GLYCOLAX/MIRALAX) powder 255 grams one bottle for colonoscopy prep 255 g 0   No current facility-administered medications for this visit.    Review of Systems Review of Systems   Constitutional: Negative.   Respiratory: Negative.   Cardiovascular: Negative.   Gastrointestinal: Negative for diarrhea, constipation, anal bleeding and rectal pain.    Blood pressure 124/78, pulse 88, resp. rate 14, height 5\' 3"  (1.6 m), weight 171 lb (77.565 kg).  Physical Exam Physical Exam  Constitutional: She is oriented to person, place, and time. She appears well-developed and well-nourished.  Eyes: Conjunctivae are normal. No scleral icterus.  Neck: Neck supple.  Cardiovascular: Normal rate, regular rhythm and normal heart sounds.   Pulmonary/Chest: Effort normal and breath sounds normal.  Abdominal: Soft. Normal appearance and bowel sounds are normal. There is no hepatosplenomegaly. There is no tenderness.  Lymphadenopathy:    She has no cervical adenopathy.  Neurological: She is alert and oriented to person, place, and time.  Skin: Skin is warm and dry.    Data Reviewed none  Assessment    Pt due for her first screening colonoscopy.     Plan    Colonoscopy with possible biopsy/polypectomy prn: Information regarding the procedure, including its potential risks and complications (including but not limited to perforation of the bowel, which may require emergency surgery to repair, and bleeding) was verbally given to the patient. Educational information regarding lower  instestinal endoscopy was given to the patient. Written instructions for how to complete the bowel prep using Miralax were provided. The importance of drinking ample fluids to avoid dehydration as a result of the prep emphasized.  [facility]  PCP:  Tower, Idamae SchullerMarne Ref: Stevie KernRosenow, Phillip  SANKAR,SEEPLAPUTHUR G 12/20/2013, 6:24 AM

## 2013-12-20 ENCOUNTER — Encounter: Payer: Self-pay | Admitting: General Surgery

## 2014-01-16 ENCOUNTER — Other Ambulatory Visit: Payer: Self-pay | Admitting: General Surgery

## 2014-01-16 DIAGNOSIS — Z1211 Encounter for screening for malignant neoplasm of colon: Secondary | ICD-10-CM

## 2014-01-22 ENCOUNTER — Ambulatory Visit: Payer: Self-pay | Admitting: General Surgery

## 2014-01-22 DIAGNOSIS — Z1211 Encounter for screening for malignant neoplasm of colon: Secondary | ICD-10-CM

## 2014-01-22 HISTORY — PX: COLONOSCOPY: SHX174

## 2014-01-27 ENCOUNTER — Encounter: Payer: Self-pay | Admitting: General Surgery

## 2014-05-09 ENCOUNTER — Other Ambulatory Visit: Payer: Self-pay

## 2014-05-09 MED ORDER — LISINOPRIL-HYDROCHLOROTHIAZIDE 10-12.5 MG PO TABS: 1.0000 | ORAL_TABLET | Freq: Every day | ORAL | Status: DC

## 2014-05-09 NOTE — Telephone Encounter (Signed)
Pt left note requesting 90 day refill BP med lisinopril hctz to [facility] [address].pt advised done.

## 2014-05-14 ENCOUNTER — Ambulatory Visit
Admit: 2014-05-14 | Disposition: A | Discharge status: Home / Self Care | Payer: Self-pay | Attending: Family Medicine | Admitting: Family Medicine

## 2014-05-15 ENCOUNTER — Encounter: Payer: Self-pay | Admitting: Family Medicine

## 2014-05-16 ENCOUNTER — Telehealth: Payer: Self-pay

## 2014-05-16 NOTE — Telephone Encounter (Signed)
-----   Message from Judy PimpleMarne A Tower, MD sent at 05/15/2014  3:44 PM EDT ----- Mammogram is normal  Please note for flow sheet if you can  Due for next screening mammogram in 1 year

## 2014-05-16 NOTE — Telephone Encounter (Signed)
Patient aware of normal mammogram. 

## 2014-06-10 ENCOUNTER — Other Ambulatory Visit (INDEPENDENT_AMBULATORY_CARE_PROVIDER_SITE_OTHER): Payer: BLUE CROSS/BLUE SHIELD

## 2014-06-10 DIAGNOSIS — E876 Hypokalemia: Secondary | ICD-10-CM

## 2014-06-10 DIAGNOSIS — I10 Essential (primary) hypertension: Secondary | ICD-10-CM

## 2014-06-10 LAB — BASIC METABOLIC PANEL
BUN: 18 mg/dL (ref 6–23)
CO2: 32 meq/L (ref 19–32)
Calcium: 9.7 mg/dL (ref 8.4–10.5)
Chloride: 105 mEq/L (ref 96–112)
Creatinine, Ser: 1.3 mg/dL — ABNORMAL HIGH (ref 0.40–1.20)
GFR: 53.97 mL/min — ABNORMAL LOW (ref >60.00)
GLUCOSE: 82 mg/dL (ref 70–99)
POTASSIUM: 3.5 meq/L (ref 3.5–5.1)
Sodium: 142 mEq/L (ref 135–145)

## 2014-06-17 ENCOUNTER — Ambulatory Visit (INDEPENDENT_AMBULATORY_CARE_PROVIDER_SITE_OTHER): Payer: BLUE CROSS/BLUE SHIELD | Admitting: Family Medicine

## 2014-06-17 ENCOUNTER — Encounter: Payer: Self-pay | Admitting: Family Medicine

## 2014-06-17 VITALS — BP 140/86 | HR 72 | Temp 98.9°F | Ht 63.0 in | Wt 178.8 lb

## 2014-06-17 DIAGNOSIS — R748 Abnormal levels of other serum enzymes: Secondary | ICD-10-CM | POA: Diagnosis not present

## 2014-06-17 DIAGNOSIS — I10 Essential (primary) hypertension: Secondary | ICD-10-CM

## 2014-06-17 DIAGNOSIS — R7989 Other specified abnormal findings of blood chemistry: Secondary | ICD-10-CM

## 2014-06-17 DIAGNOSIS — E876 Hypokalemia: Secondary | ICD-10-CM

## 2014-06-17 DIAGNOSIS — R8290 Unspecified abnormal findings in urine: Secondary | ICD-10-CM

## 2014-06-17 HISTORY — DX: Other specified abnormal findings of blood chemistry: R79.89

## 2014-06-17 LAB — POCT URINALYSIS DIPSTICK
Glucose, UA: NEGATIVE
Ketones, UA: NEGATIVE
NITRITE UA: NEGATIVE
UROBILINOGEN UA: 0.2
pH, UA: 6

## 2014-06-17 MED ORDER — POTASSIUM CHLORIDE ER 10 MEQ PO TBCR: 10.0000 meq | EXTENDED_RELEASE_TABLET | Freq: Two times a day (BID) | ORAL | Status: DC

## 2014-06-17 MED ORDER — AMLODIPINE BESYLATE 5 MG PO TABS: 5.0000 mg | ORAL_TABLET | Freq: Every day | ORAL | Status: DC

## 2014-06-17 NOTE — Assessment & Plan Note (Signed)
New- ? Why Lab Results  Component Value Date   CREATININE 1.30* 06/10/2014    Will inc water -disc goals for this Avoid nsaids F/u 2-3 wk will re check  Also drop off urine spec for ua when able (could not go today)

## 2014-06-17 NOTE — Progress Notes (Signed)
Pre visit review using our clinic review tool, if applicable. No additional management support is needed unless otherwise documented below in the visit note. 

## 2014-06-17 NOTE — Assessment & Plan Note (Signed)
bp is not at goal  BP: 140/86 mmHg   Will add amlodipine  F/u 2-3 wk  Disc DASH diet and inc water (cr is elevated) Will change hctz if needed

## 2014-06-17 NOTE — Patient Instructions (Signed)
Make sure you get at least 8-12 twelve oz servings of water per day  Avoid over the counter pain medicines  Come drop off a urine specimen when you are able (your kidney number is a bit off)  Add amlodipine 5 mg each day - if any side effects please let me know  Follow up in 2-3 weeks

## 2014-06-17 NOTE — Assessment & Plan Note (Signed)
Refilled current px In good control

## 2014-06-17 NOTE — Progress Notes (Signed)
Subjective:    Patient ID: Kristin Barton, female    DOB: [birth year], 59 y.o.   MRN: [id number]  HPI Here for f/u of HTN and chronic health problems   Wt is up 7lb with bmi of 31 In obese category  BP Readings from Last 3 Encounters:  06/17/14 140/86  12/19/13 124/78  12/17/13 136/84    Up a bit on first check today - just took med before coming here  Re check 142/90     Chemistry      Component Value Date/Time   NA 142 06/10/2014 0903   NA 142 11/15/2013 1725   K 3.5 06/10/2014 0903   K 3.4* 11/15/2013 1725   CL 105 06/10/2014 0903   CL 105 11/15/2013 1725   CO2 32 06/10/2014 0903   CO2 28 11/15/2013 1725   BUN 18 06/10/2014 0903   BUN 14 11/15/2013 1725   CREATININE 1.30* 06/10/2014 0903   CREATININE 0.96 11/15/2013 1725      Component Value Date/Time   CALCIUM 9.7 06/10/2014 0903   CALCIUM 9.0 11/15/2013 1725   ALKPHOS 52 05/17/2013 1316   AST 27 05/17/2013 1316   ALT 17 05/17/2013 1316   BILITOT 0.5 05/17/2013 1316      Cr is up - ? 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    Bilirubin, UA Trace    Ketones, UA neg.    Spec Grav, UA >=1.030    Blood, UA Large    pH, UA 6.0    Protein, UA Trace    Urobilinogen, UA 0.2    Nitrite, UA Neg.    Leukocytes, UA large (3+)      Patient Active Problem List   Diagnosis Date Noted  . Elevated serum creatinine 06/17/2014  . Syncope 11/22/2013  . Leg cramp 07/05/2013  . Inflamed skin tag  06/14/2013  . Other screening mammogram 08/23/2010  . Special screening for malignant neoplasms, colon 08/23/2010  . Routine general medical examination at a health care facility 08/17/2010  . HYPOKALEMIA 03/12/2009  . HYPERTENSION, BENIGN ESSENTIAL 01/22/2009   Past Medical History  Diagnosis Date  . Hypertension   . Hypokalemia    Past Surgical History  Procedure Laterality Date  . Partial hysterectomy      for cyst, has one ovary remaining (no hx of abn pap smears)  . Cesarean section  1987  . Tubal ligation     History  Substance Use Topics  . Smoking status: Never Smoker   . Smokeless tobacco: Not on file  . Alcohol Use: No   Family History  Problem Relation Age of Onset  . Hypertension Mother   . Asthma Father   . COPD Father     cigars  . Lung cancer Father   . Breast cancer Sister   .  Diabetes Sister     mild  . Other      ? heart problems- distant family  . Mental illness Other     ? in family   No Known Allergies Current Outpatient Prescriptions on File Prior to Visit  Medication Sig Dispense Refill  . lisinopril-hydrochlorothiazide (PRINZIDE,ZESTORETIC) 10-12.5 MG per tablet Take 1 tablet by mouth daily. 90 tablet 1  . Multiple Vitamin (MULTIVITAMIN) tablet Take 1 tablet by mouth daily.      . potassium chloride (K-DUR) 10 MEQ tablet Take 1 tablet (10 mEq total) by mouth 2 (two) times daily. 90 tablet 3   No current facility-administered medications on file prior to visit.    Review of Systems Review of Systems  Constitutional: Negative for fever, appetite change, fatigue and unexpected weight change.  Eyes: Negative for pain and visual disturbance.  Respiratory: Negative for cough and shortness of breath.   Cardiovascular: Negative for cp or palpitations    Gastrointestinal: Negative for nausea, diarrhea and constipation.  Genitourinary: Negative for urgency and frequency.  Skin: Negative for pallor or rash   Neurological: Negative for weakness,  light-headedness, numbness and headaches.  Hematological: Negative for adenopathy. Does not bruise/bleed easily.  Psychiatric/Behavioral: Negative for dysphoric mood. The patient is not nervous/anxious.         Objective:   Physical Exam  Constitutional: She appears well-developed and well-nourished. No distress.  obese and well appearing   HENT:  Head: Normocephalic and atraumatic.  Mouth/Throat: Oropharynx is clear and moist.  Eyes: Conjunctivae and EOM are normal. Pupils are equal, round, and reactive to light.  Neck: Normal range of motion. Neck supple. No JVD present. Carotid bruit is not present. No thyromegaly present.  Cardiovascular: Normal rate, regular rhythm, normal heart sounds and intact distal pulses.  Exam reveals no gallop.   Pulmonary/Chest: Effort normal and breath sounds normal. No respiratory distress. She has no wheezes. She has no rales.  No crackles  Abdominal: Soft. Bowel sounds are normal. She exhibits no distension, no abdominal bruit and no mass. There is no tenderness.  No cva tenderness   Musculoskeletal: She exhibits no edema.  Lymphadenopathy:    She has no cervical adenopathy.  Neurological: She is alert. She has normal reflexes.  Skin: Skin is warm and dry. No rash noted.  Psychiatric: She has a normal mood and affect.          Assessment & Plan:   Problem List Items Addressed This Visit    Abnormal urinalysis    With elevated cr Results for orders placed or performed in visit on 06/17/14  POCT urinalysis dipstick  Result Value Ref Range   Color, UA Yellow    Clarity, UA Hazy    Glucose, UA Neg.    Bilirubin, UA Trace    Ketones, UA neg.    Spec Grav, UA >=1.030    Blood, UA Large    pH, UA 6.0    Protein, UA Trace    Urobilinogen, UA 0.2    Nitrite, UA Neg.    Leukocytes, UA large (3+)    sent for culture Urged to drink more water due to high SG      Relevant Orders   Urine culture   Elevated serum creatinine    New- ?  Why Lab Results  Component Value Date   CREATININE 1.30* 06/10/2014    Will inc water -disc goals for this Avoid nsaids F/u 2-3 wk will re check  Also drop off urine  spec for ua when able (could not go today)      Relevant Orders   POCT urinalysis dipstick (Completed)   HYPERTENSION, BENIGN ESSENTIAL - Primary    bp is not at goal  BP: 140/86 mmHg   Will add amlodipine  F/u 2-3 wk  Disc DASH diet and inc water (cr is elevated) Will change hctz if needed      Relevant Medications   amLODipine (NORVASC) 5 MG tablet   HYPOKALEMIA    Refilled current px In good control

## 2014-06-18 ENCOUNTER — Telehealth: Payer: Self-pay | Admitting: Family Medicine

## 2014-06-18 DIAGNOSIS — R8290 Unspecified abnormal findings in urine: Secondary | ICD-10-CM | POA: Insufficient documentation

## 2014-06-18 HISTORY — DX: Unspecified abnormal findings in urine: R82.90

## 2014-06-18 NOTE — Telephone Encounter (Signed)
Addressed through result notes  

## 2014-06-18 NOTE — Telephone Encounter (Signed)
Pt came in saying she missed a phone call from someone here. She didn't get a message. She had an urine sample done yesterday. She said the best time to reach her is when she gets off work at 8am. It is okay to leave a message on her machine.

## 2014-06-18 NOTE — Assessment & Plan Note (Signed)
With elevated cr Results for orders placed or performed in visit on 06/17/14  POCT urinalysis dipstick  Result Value Ref Range   Color, UA Yellow    Clarity, UA Hazy    Glucose, UA Neg.    Bilirubin, UA Trace    Ketones, UA neg.    Spec Grav, UA >=1.030    Blood, UA Large    pH, UA 6.0    Protein, UA Trace    Urobilinogen, UA 0.2    Nitrite, UA Neg.    Leukocytes, UA large (3+)    sent for culture Urged to drink more water due to high SG

## 2014-06-19 LAB — URINE CULTURE

## 2014-06-23 ENCOUNTER — Other Ambulatory Visit (INDEPENDENT_AMBULATORY_CARE_PROVIDER_SITE_OTHER): Payer: BLUE CROSS/BLUE SHIELD

## 2014-06-23 DIAGNOSIS — R8290 Unspecified abnormal findings in urine: Secondary | ICD-10-CM

## 2014-06-25 LAB — URINE CULTURE

## 2014-07-01 ENCOUNTER — Ambulatory Visit (INDEPENDENT_AMBULATORY_CARE_PROVIDER_SITE_OTHER): Payer: BLUE CROSS/BLUE SHIELD | Admitting: Family Medicine

## 2014-07-01 ENCOUNTER — Encounter: Payer: Self-pay | Admitting: Family Medicine

## 2014-07-01 VITALS — BP 122/80 | HR 84 | Temp 98.2°F | Ht 63.0 in | Wt 176.8 lb

## 2014-07-01 DIAGNOSIS — I10 Essential (primary) hypertension: Secondary | ICD-10-CM | POA: Diagnosis not present

## 2014-07-01 DIAGNOSIS — R8290 Unspecified abnormal findings in urine: Secondary | ICD-10-CM | POA: Diagnosis not present

## 2014-07-01 DIAGNOSIS — N390 Urinary tract infection, site not specified: Secondary | ICD-10-CM | POA: Insufficient documentation

## 2014-07-01 DIAGNOSIS — R748 Abnormal levels of other serum enzymes: Secondary | ICD-10-CM | POA: Diagnosis not present

## 2014-07-01 DIAGNOSIS — E669 Obesity, unspecified: Secondary | ICD-10-CM | POA: Diagnosis not present

## 2014-07-01 DIAGNOSIS — N3000 Acute cystitis without hematuria: Secondary | ICD-10-CM

## 2014-07-01 DIAGNOSIS — R7989 Other specified abnormal findings of blood chemistry: Secondary | ICD-10-CM

## 2014-07-01 HISTORY — DX: Obesity, unspecified: E66.9

## 2014-07-01 LAB — RENAL FUNCTION PANEL
ALBUMIN: 4.6 g/dL (ref 3.5–5.2)
BUN: 20 mg/dL (ref 6–23)
CHLORIDE: 99 meq/L (ref 96–112)
CO2: 31 mEq/L (ref 19–32)
Calcium: 10 mg/dL (ref 8.4–10.5)
Creatinine, Ser: 1.35 mg/dL — ABNORMAL HIGH (ref 0.40–1.20)
GFR: 51.66 mL/min — ABNORMAL LOW (ref >60.00)
GLUCOSE: 92 mg/dL (ref 70–99)
PHOSPHORUS: 4.1 mg/dL (ref 2.3–4.6)
Potassium: 3.5 mEq/L (ref 3.5–5.1)
SODIUM: 138 meq/L (ref 135–145)

## 2014-07-01 LAB — POCT URINALYSIS DIPSTICK
Glucose, UA: NEGATIVE
Nitrite, UA: NEGATIVE
PH UA: 6.5
Spec Grav, UA: 1.025
Urobilinogen, UA: 0.2

## 2014-07-01 MED ORDER — CEPHALEXIN 250 MG PO CAPS: 250.0000 mg | ORAL_CAPSULE | Freq: Two times a day (BID) | ORAL | Status: DC

## 2014-07-01 MED ORDER — AMLODIPINE BESYLATE 5 MG PO TABS: 5.0000 mg | ORAL_TABLET | Freq: Every day | ORAL | Status: DC

## 2014-07-01 NOTE — Patient Instructions (Signed)
Labs today for kidney function  Keep up the good work with better water intake  Take keflex for possible urinary infection -we will culture urine again and contact you with results  Continue other medicines - blood pressure is much better

## 2014-07-01 NOTE — Assessment & Plan Note (Signed)
UA is pos again today - though we have had a hard time getting a non contaminated cx Re check renal panel today Cover with keflex 5d -pend culture result Enc water intake- doing better

## 2014-07-01 NOTE — Assessment & Plan Note (Signed)
Discussed how this problem influences overall health and the risks it imposes  Reviewed plan for weight loss with lower calorie diet (via better food choices and also portion control or program like weight watchers) and exercise building up to or more than 30 minutes 5 days per week including some aerobic activity   Lost 2 lb so far-enc to keep it up

## 2014-07-01 NOTE — Progress Notes (Signed)
Pre visit review using our clinic review tool, if applicable. No additional management support is needed unless otherwise documented below in the visit note. 

## 2014-07-01 NOTE — Assessment & Plan Note (Signed)
Improved with amlodipine- and also DASH diet and better water intake  Counseled on lifestyle change and wt loss

## 2014-07-01 NOTE — Assessment & Plan Note (Signed)
Re check today with inc water intake  ua still abn- sent for cx again and will tx with keflex empirically - still worried about uti even though cx shows mult bacterial  Renal panel today  Plan when that returns

## 2014-07-01 NOTE — Progress Notes (Signed)
Subjective:    Patient ID: Kristin Barton, female    DOB: 04/01/1955, 59 y.o.   MRN: [id number]  HPI Here for f/u of HTN and renal insuff  Wt is down 2 lb with bmi of 31 Working on it   Elevated cr last visit Lab Results  Component Value Date   CREATININE 1.30* 06/10/2014   BUN 18 06/10/2014   NA 142 06/10/2014   K 3.5 06/10/2014   CL 105 06/10/2014   CO2 32 06/10/2014    Due for a re check Counseled on water intake -- has 20 oz bottle - drinks that during the day at work - keeps filling it over the work shift (2 times)  At home also drinks water   Staying away from salty and processed   ua today is still abn  Results for orders placed or performed in visit on 07/01/14  POCT urinalysis dipstick  Result Value Ref Range   Color, UA Yellow    Clarity, UA Hazy    Glucose, UA Neg.    Bilirubin, UA Small    Ketones, UA Trace    Spec Grav, UA 1.025    Blood, UA Moderate    pH, UA 6.5    Protein, UA Trace    Urobilinogen, UA 0.2    Nitrite, UA Neg.    Leukocytes, UA moderate (2+)      Has had several urine cx - which showed mult bacteria/ ? Contaminated  BP Readings from Last 3 Encounters:  07/01/14 132/84  06/17/14 140/86  12/19/13 124/78    Better with amlodipine  No side effects  No HA, leg swelling or CP or other symptoms   Patient Active Problem List   Diagnosis Date Noted  . Obesity 07/01/2014  . UTI (urinary tract infection) 07/01/2014  . Abnormal urinalysis 06/18/2014  . Elevated serum creatinine 06/17/2014  . Syncope 11/22/2013  . Leg cramp 07/05/2013  . Inflamed skin tag 06/14/2013  . Other screening mammogram 08/23/2010  . Special screening for malignant neoplasms, colon 08/23/2010  . Routine general medical examination at a health care facility 08/17/2010  . HYPOKALEMIA 03/12/2009  . HYPERTENSION, BENIGN ESSENTIAL 01/22/2009   Past Medical History  Diagnosis Date  . Hypertension   . Hypokalemia    Past Surgical History  Procedure  Laterality Date  . Partial hysterectomy      for cyst, has one ovary remaining (no hx of abn pap smears)  . Cesarean section  1987  . Tubal ligation     History  Substance Use Topics  . Smoking status: Never Smoker   . Smokeless tobacco: Not on file  . Alcohol Use: No   Family History  Problem Relation Age of Onset  . Hypertension Mother   . Asthma Father   . COPD Father     cigars  . Lung cancer Father   . Breast cancer Sister   . Diabetes Sister     mild  . Other      ? heart problems- distant family  . Mental illness Other     ? in family   No Known Allergies Current Outpatient Prescriptions on File Prior to Visit  Medication Sig Dispense Refill  . lisinopril-hydrochlorothiazide (PRINZIDE,ZESTORETIC) 10-12.5 MG per tablet Take 1 tablet by mouth daily. 90 tablet 1  . Multiple Vitamin (MULTIVITAMIN) tablet Take 1 tablet by mouth daily.      . potassium chloride (K-DUR) 10 MEQ tablet Take 1 tablet (10 mEq total) by mouth  2 (two) times daily. 60 tablet 5   No current facility-administered medications on file prior to visit.     Review of Systems Review of Systems  Constitutional: Negative for fever, appetite change, fatigue and unexpected weight change.  Eyes: Negative for pain and visual disturbance.  Respiratory: Negative for cough and shortness of breath.   Cardiovascular: Negative for cp or palpitations    Gastrointestinal: Negative for nausea, diarrhea and constipation.  Genitourinary: Negative for urgency and frequency. neg for dysuria or hematuria  Skin: Negative for pallor or rash   Neurological: Negative for weakness, light-headedness, numbness and headaches.  Hematological: Negative for adenopathy. Does not bruise/bleed easily.  Psychiatric/Behavioral: Negative for dysphoric mood. The patient is not nervous/anxious.         Objective:   Physical Exam  Constitutional: She appears well-developed and well-nourished. No distress.  HENT:  Head:  Normocephalic and atraumatic.  Eyes: Conjunctivae and EOM are normal. Pupils are equal, round, and reactive to light.  Neck: Normal range of motion. Neck supple. Carotid bruit is not present.  Cardiovascular: Normal rate, regular rhythm and normal heart sounds.   Pulmonary/Chest: Effort normal and breath sounds normal.  Abdominal: Soft. Bowel sounds are normal. She exhibits no distension and no mass. There is no tenderness. There is no rebound and no guarding.  No cva tenderness  No suprapubic tenderness or fullness    Musculoskeletal: She exhibits no edema.  Lymphadenopathy:    She has no cervical adenopathy.  Neurological: She is alert.  Skin: No rash noted.  Psychiatric: She has a normal mood and affect.          Assessment & Plan:   Problem List Items Addressed This Visit    Elevated serum creatinine    Re check today with inc water intake  ua still abn- sent for cx again and will tx with keflex empirically - still worried about uti even though cx shows mult bacterial  Renal panel today  Plan when that returns       Relevant Orders   Renal function panel (Completed)   Urine culture   HYPERTENSION, BENIGN ESSENTIAL - Primary    Improved with amlodipine- and also DASH diet and better water intake  Counseled on lifestyle change and wt loss       Relevant Medications   amLODipine (NORVASC) 5 MG tablet   Obesity    Discussed how this problem influences overall health and the risks it imposes  Reviewed plan for weight loss with lower calorie diet (via better food choices and also portion control or program like weight watchers) and exercise building up to or more than 30 minutes 5 days per week including some aerobic activity   Lost 2 lb so far-enc to keep it up       UTI (urinary tract infection)    UA is pos again today - though we have had a hard time getting a non contaminated cx Re check renal panel today Cover with keflex 5d -pend culture result Enc water  intake- doing better       Relevant Medications   cephALEXin (KEFLEX) 250 MG capsule   Other Relevant Orders   Urine culture    Other Visit Diagnoses    Abnormal finding on urinalysis        Relevant Orders    POCT urinalysis dipstick (Completed)

## 2014-07-03 LAB — URINE CULTURE: Colony Count: 8000

## 2014-07-16 ENCOUNTER — Other Ambulatory Visit (INDEPENDENT_AMBULATORY_CARE_PROVIDER_SITE_OTHER): Payer: BLUE CROSS/BLUE SHIELD

## 2014-07-16 DIAGNOSIS — R8290 Unspecified abnormal findings in urine: Secondary | ICD-10-CM

## 2014-07-16 LAB — POCT URINALYSIS DIPSTICK
Glucose, UA: NEGATIVE
Nitrite, UA: NEGATIVE
PH UA: 6
RBC UA: NEGATIVE
Spec Grav, UA: >=1.03
Urobilinogen, UA: 0.2

## 2014-07-18 LAB — URINE CULTURE

## 2014-07-29 ENCOUNTER — Encounter: Payer: Self-pay | Admitting: *Deleted

## 2014-08-13 ENCOUNTER — Telehealth: Payer: Self-pay | Admitting: Family Medicine

## 2014-08-13 ENCOUNTER — Other Ambulatory Visit: Payer: Self-pay | Admitting: Family Medicine

## 2014-08-13 DIAGNOSIS — I10 Essential (primary) hypertension: Secondary | ICD-10-CM

## 2014-08-13 DIAGNOSIS — R7989 Other specified abnormal findings of blood chemistry: Secondary | ICD-10-CM

## 2014-08-13 NOTE — Telephone Encounter (Signed)
-----   Message from Terri J Walsh sent at 08/08/2014 11:26 AM EDT ----- Regarding: Lab orders for Thursday, 7.14.16 Renal panel?  

## 2014-08-13 NOTE — Telephone Encounter (Signed)
-----   Message from Alvina Chouerri J Walsh sent at 08/08/2014 11:26 AM EDT ----- Regarding: Lab orders for Thursday, 7.14.16 Renal panel?

## 2014-08-14 ENCOUNTER — Other Ambulatory Visit (INDEPENDENT_AMBULATORY_CARE_PROVIDER_SITE_OTHER): Payer: BLUE CROSS/BLUE SHIELD

## 2014-08-14 ENCOUNTER — Encounter: Payer: Self-pay | Admitting: *Deleted

## 2014-08-14 DIAGNOSIS — R7989 Other specified abnormal findings of blood chemistry: Secondary | ICD-10-CM

## 2014-08-14 DIAGNOSIS — I10 Essential (primary) hypertension: Secondary | ICD-10-CM | POA: Diagnosis not present

## 2014-08-14 DIAGNOSIS — R748 Abnormal levels of other serum enzymes: Secondary | ICD-10-CM | POA: Diagnosis not present

## 2014-08-14 LAB — CBC WITH DIFFERENTIAL/PLATELET
BASOS ABS: 0 10*3/uL (ref 0.0–0.1)
Basophils Relative: 0.5 % (ref 0.0–3.0)
EOS ABS: 0.1 10*3/uL (ref 0.0–0.7)
EOS PCT: 2.9 % (ref 0.0–5.0)
HEMATOCRIT: 34.9 % — AB (ref 36.0–46.0)
Hemoglobin: 11.8 g/dL — ABNORMAL LOW (ref 12.0–15.0)
LYMPHS PCT: 38.7 % (ref 12.0–46.0)
Lymphs Abs: 1.8 10*3/uL (ref 0.7–4.0)
MCHC: 33.9 g/dL (ref 30.0–36.0)
MCV: 90.1 fl (ref 78.0–100.0)
Monocytes Absolute: 0.4 10*3/uL (ref 0.1–1.0)
Monocytes Relative: 8.3 % (ref 3.0–12.0)
NEUTROS PCT: 49.6 % (ref 43.0–77.0)
Neutro Abs: 2.3 10*3/uL (ref 1.4–7.7)
Platelets: 212 10*3/uL (ref 150.0–400.0)
RBC: 3.87 Mil/uL (ref 3.87–5.11)
RDW: 14.2 % (ref 11.5–15.5)
WBC: 4.6 10*3/uL (ref 4.0–10.5)

## 2014-08-14 LAB — RENAL FUNCTION PANEL
Albumin: 4.2 g/dL (ref 3.5–5.2)
BUN: 19 mg/dL (ref 6–23)
CHLORIDE: 102 meq/L (ref 96–112)
CO2: 31 meq/L (ref 19–32)
CREATININE: 1.1 mg/dL (ref 0.40–1.20)
Calcium: 9.6 mg/dL (ref 8.4–10.5)
GFR: 65.41 mL/min (ref >60.00)
Glucose, Bld: 81 mg/dL (ref 70–99)
PHOSPHORUS: 3.3 mg/dL (ref 2.3–4.6)
Potassium: 3.6 mEq/L (ref 3.5–5.1)
Sodium: 140 mEq/L (ref 135–145)

## 2014-11-10 ENCOUNTER — Other Ambulatory Visit: Payer: Self-pay | Admitting: *Deleted

## 2014-11-10 MED ORDER — POTASSIUM CHLORIDE ER 10 MEQ PO TBCR: 10.0000 meq | EXTENDED_RELEASE_TABLET | Freq: Two times a day (BID) | ORAL | Status: DC

## 2014-11-10 MED ORDER — LISINOPRIL-HYDROCHLOROTHIAZIDE 10-12.5 MG PO TABS: 1.0000 | ORAL_TABLET | Freq: Every day | ORAL | Status: DC

## 2014-11-10 NOTE — Telephone Encounter (Signed)
Her labs improved at the last draw after tx her with abx  Please refill for a year Thanks for checking

## 2014-11-10 NOTE — Telephone Encounter (Signed)
done

## 2014-11-10 NOTE — Telephone Encounter (Signed)
Patient last seen 07/01/14.  H/o elevated renal function tests.  Okay to refill medications?

## 2014-12-02 ENCOUNTER — Telehealth: Payer: Self-pay | Admitting: *Deleted

## 2014-12-02 NOTE — Telephone Encounter (Signed)
Kristin Barton said pt was in their office with elevated BP and pt told them that had requested refill BP med on 11/09/14 and was never called in to [facility] [address]. Spoke with Casper [facility]Jameesha at [facility] and rx was sent to pharmacy and was ready for pick up for 9 days and then put back on shelf; if pt needs refill of med available at [facility] [address]. Kristin Barton will let pt know.

## 2015-06-18 DIAGNOSIS — A630 Anogenital (venereal) warts: Secondary | ICD-10-CM | POA: Diagnosis not present

## 2015-12-16 ENCOUNTER — Other Ambulatory Visit: Payer: Self-pay

## 2015-12-16 MED ORDER — LISINOPRIL-HYDROCHLOROTHIAZIDE 10-12.5 MG PO TABS: 1.0000 | ORAL_TABLET | Freq: Every day | ORAL | 0 refills | Status: DC

## 2015-12-16 NOTE — Telephone Encounter (Signed)
Pt left note out of lisinopril. Pt last seen 07/01/14; last refilled # 90 x 3 on 11/10/14. No future appt scheduled. Please advise. [facility] [address].

## 2015-12-16 NOTE — Telephone Encounter (Signed)
I refilled it once  Please schedule a f/u appt  Thanks

## 2015-12-17 NOTE — Telephone Encounter (Signed)
Pt notified Rx sent and f/u appt scheduled  

## 2016-01-11 ENCOUNTER — Encounter: Payer: Self-pay | Admitting: Family Medicine

## 2016-01-11 ENCOUNTER — Ambulatory Visit (INDEPENDENT_AMBULATORY_CARE_PROVIDER_SITE_OTHER): Payer: BLUE CROSS/BLUE SHIELD | Admitting: Family Medicine

## 2016-01-11 VITALS — BP 118/78 | HR 79 | Temp 98.6°F | Wt 186.0 lb

## 2016-01-11 DIAGNOSIS — Z114 Encounter for screening for human immunodeficiency virus [HIV]: Secondary | ICD-10-CM | POA: Diagnosis not present

## 2016-01-11 DIAGNOSIS — I10 Essential (primary) hypertension: Secondary | ICD-10-CM

## 2016-01-11 DIAGNOSIS — E6609 Other obesity due to excess calories: Secondary | ICD-10-CM

## 2016-01-11 DIAGNOSIS — Z6832 Body mass index (BMI) 32.0-32.9, adult: Secondary | ICD-10-CM

## 2016-01-11 DIAGNOSIS — E876 Hypokalemia: Secondary | ICD-10-CM | POA: Diagnosis not present

## 2016-01-11 DIAGNOSIS — Z1159 Encounter for screening for other viral diseases: Secondary | ICD-10-CM | POA: Insufficient documentation

## 2016-01-11 HISTORY — DX: Encounter for screening for other viral diseases: Z11.59

## 2016-01-11 LAB — CBC WITH DIFFERENTIAL/PLATELET
BASOS PCT: 0.4 % (ref 0.0–3.0)
Basophils Absolute: 0 10*3/uL (ref 0.0–0.1)
EOS PCT: 2 % (ref 0.0–5.0)
Eosinophils Absolute: 0.1 10*3/uL (ref 0.0–0.7)
HEMATOCRIT: 35.9 % — AB (ref 36.0–46.0)
HEMOGLOBIN: 12.1 g/dL (ref 12.0–15.0)
Lymphocytes Relative: 30.6 % (ref 12.0–46.0)
Lymphs Abs: 1.9 10*3/uL (ref 0.7–4.0)
MCHC: 33.8 g/dL (ref 30.0–36.0)
MCV: 90.3 fl (ref 78.0–100.0)
MONO ABS: 0.4 10*3/uL (ref 0.1–1.0)
MONOS PCT: 7.3 % (ref 3.0–12.0)
Neutro Abs: 3.6 10*3/uL (ref 1.4–7.7)
Neutrophils Relative %: 59.7 % (ref 43.0–77.0)
Platelets: 250 10*3/uL (ref 150.0–400.0)
RBC: 3.97 Mil/uL (ref 3.87–5.11)
RDW: 14.1 % (ref 11.5–15.5)
WBC: 6.1 10*3/uL (ref 4.0–10.5)

## 2016-01-11 LAB — COMPREHENSIVE METABOLIC PANEL
ALBUMIN: 4.3 g/dL (ref 3.5–5.2)
ALK PHOS: 52 U/L (ref 39–117)
ALT: 14 U/L (ref 0–35)
AST: 18 U/L (ref 0–37)
BUN: 25 mg/dL — AB (ref 6–23)
CALCIUM: 9.6 mg/dL (ref 8.4–10.5)
CHLORIDE: 104 meq/L (ref 96–112)
CO2: 30 mEq/L (ref 19–32)
Creatinine, Ser: 1.28 mg/dL — ABNORMAL HIGH (ref 0.40–1.20)
GFR: 54.65 mL/min — AB (ref >60.00)
Glucose, Bld: 88 mg/dL (ref 70–99)
POTASSIUM: 3.8 meq/L (ref 3.5–5.1)
SODIUM: 142 meq/L (ref 135–145)
TOTAL PROTEIN: 7.5 g/dL (ref 6.0–8.3)
Total Bilirubin: 0.3 mg/dL (ref 0.2–1.2)

## 2016-01-11 LAB — LIPID PANEL
CHOL/HDL RATIO: 3
CHOLESTEROL: 196 mg/dL (ref 0–200)
HDL: 73.4 mg/dL (ref >39.00)
LDL CALC: 103 mg/dL — AB (ref 0–99)
NonHDL: 122.56
TRIGLYCERIDES: 98 mg/dL (ref 0.0–149.0)
VLDL: 19.6 mg/dL (ref 0.0–40.0)

## 2016-01-11 LAB — TSH: TSH: 2.59 u[IU]/mL (ref 0.35–4.50)

## 2016-01-11 MED ORDER — LISINOPRIL-HYDROCHLOROTHIAZIDE 10-12.5 MG PO TABS: 1.0000 | ORAL_TABLET | Freq: Every day | ORAL | 3 refills | Status: DC

## 2016-01-11 MED ORDER — POTASSIUM CHLORIDE ER 10 MEQ PO TBCR: 10.0000 meq | EXTENDED_RELEASE_TABLET | Freq: Two times a day (BID) | ORAL | 3 refills | Status: DC

## 2016-01-11 NOTE — Assessment & Plan Note (Signed)
Hep C screen today 

## 2016-01-11 NOTE — Assessment & Plan Note (Signed)
Discussed how this problem influences overall health and the risks it imposes  Reviewed plan for weight loss with lower calorie diet (via better food choices and also portion control or program like weight watchers) and exercise building up to or more than 30 minutes 5 days per week including some aerobic activity   Enc to get back to exercise - she plans to get a DVD player to use exercise videos daily  She is also looking at some simple exercise equip since she no longer walks in her new neighborhood Enc healthy diet/smaller portions

## 2016-01-11 NOTE — Progress Notes (Signed)
Subjective:    Patient ID: Kristin Barton, female    DOB: 1955/11/05, 60 y.o.   MRN: [id number]  HPI Here for f/u of chronic medical problems   [place] and now not walking due to the fact she does not know the neighborhood  Used to walk every day  Also weather has been good   Is going to get a pc of exercise equipment   Wt Readings from Last 3 Encounters:  01/11/16 186 lb (84.4 kg)  07/01/14 176 lb 12 oz (80.2 kg)  06/17/14 178 lb 12 oz (81.1 kg)  bmi is 32.9 Diet is pretty good - eating about the same    bp is stable today  No cp or palpitations or headaches or edema  No side effects to medicines  BP Readings from Last 3 Encounters:  01/11/16 118/78  07/01/14 122/80  06/17/14 140/86    On lisinopril hct and K   Ran out of potassium - a week ago / that may be low   Drinks lots of water to stay hydrated    does not take flu shots    Lab Results  Component Value Date   CREATININE 1.10 08/14/2014   BUN 19 08/14/2014   NA 140 08/14/2014   K 3.6 08/14/2014   CL 102 08/14/2014   CO2 31 08/14/2014    Patient Active Problem List   Diagnosis Date Noted  . Need for hepatitis C screening test 01/11/2016  . Screening for HIV (human immunodeficiency virus) 01/11/2016  . Obesity 07/01/2014  . Abnormal urinalysis 06/18/2014  . Elevated serum creatinine 06/17/2014  . Syncope 11/22/2013  . Leg cramp 07/05/2013  . Other screening mammogram 08/23/2010  . Special screening for malignant neoplasms, colon 08/23/2010  . Routine general medical examination at a health care facility 08/17/2010  . HYPOKALEMIA 03/12/2009  . HYPERTENSION, BENIGN ESSENTIAL 01/22/2009   Past Medical History:  Diagnosis Date  . Hypertension   . Hypokalemia    Past Surgical History:  Procedure Laterality Date  . CESAREAN SECTION  1987  . PARTIAL HYSTERECTOMY     for cyst, has one ovary remaining (no hx of abn pap smears)  . TUBAL LIGATION     Social History  Substance Use Topics   . Smoking status: Never Smoker  . Smokeless tobacco: Not on file  . Alcohol use No   Family History  Problem Relation Age of Onset  . Hypertension Mother   . Asthma Father   . COPD Father     cigars  . Lung cancer Father   . Breast cancer Sister   . Diabetes Sister     mild  . Other      ? heart problems- distant family  . Mental illness Other     ? in family   No Known Allergies Current Outpatient Prescriptions on File Prior to Visit  Medication Sig Dispense Refill  . Multiple Vitamin (MULTIVITAMIN) tablet Take 1 tablet by mouth daily.       No current facility-administered medications on file prior to visit.     Review of Systems Review of Systems  Constitutional: Negative for fever, appetite change, fatigue and unexpected weight change. pos for expected wt gain from no exercise and long work hours Eyes: Negative for pain and visual disturbance.  Respiratory: Negative for cough and shortness of breath.   Cardiovascular: Negative for cp or palpitations    Gastrointestinal: Negative for nausea, diarrhea and constipation.  Genitourinary: Negative for  urgency and frequency.  Skin: Negative for pallor or rash   Neurological: Negative for weakness, light-headedness, numbness and headaches.  Hematological: Negative for adenopathy. Does not bruise/bleed easily.  Psychiatric/Behavioral: Negative for dysphoric mood. The patient is not nervous/anxious.         Objective:   Physical Exam  Constitutional: She appears well-developed and well-nourished. No distress.  obese and well appearing   HENT:  Head: Normocephalic and atraumatic.  Mouth/Throat: Oropharynx is clear and moist.  Eyes: Conjunctivae and EOM are normal. Pupils are equal, round, and reactive to light.  Neck: Normal range of motion. Neck supple. No JVD present. Carotid bruit is not present. No thyromegaly present.  Cardiovascular: Normal rate, regular rhythm, normal heart sounds and intact distal pulses.  Exam  reveals no gallop.   Pulmonary/Chest: Effort normal and breath sounds normal. No respiratory distress. She has no wheezes. She has no rales.  No crackles  Abdominal: Soft. Bowel sounds are normal. She exhibits no distension, no abdominal bruit and no mass. There is no tenderness.  Musculoskeletal: She exhibits no edema.  Lymphadenopathy:    She has no cervical adenopathy.  Neurological: She is alert. She has normal reflexes.  Skin: Skin is warm and dry. No rash noted.  Psychiatric: She has a normal mood and affect.  Pleasant and talkative          Assessment & Plan:   Problem List Items Addressed This Visit      Cardiovascular and Mediastinum   HYPERTENSION, BENIGN ESSENTIAL - Primary    bp in fair control at this time  BP Readings from Last 1 Encounters:  01/11/16 118/78   No changes needed Disc lifstyle change with low sodium diet and exercise   Labs today  Wt loss and exercise encouraged      Relevant Medications   lisinopril-hydrochlorothiazide (PRINZIDE,ZESTORETIC) 10-12.5 MG tablet   Other Relevant Orders   CBC with Differential/Platelet   Comprehensive metabolic panel   TSH   Lipid panel     Other   Screening for HIV (human immunodeficiency virus)    Hiv screen today      Relevant Orders   HIV antibody (with reflex)   Obesity    Discussed how this problem influences overall health and the risks it imposes  Reviewed plan for weight loss with lower calorie diet (via better food choices and also portion control or program like weight watchers) and exercise building up to or more than 30 minutes 5 days per week including some aerobic activity   Enc to get back to exercise - she plans to get a DVD player to use exercise videos daily  She is also looking at some simple exercise equip since she no longer walks in her new neighborhood Enc healthy diet/smaller portions      Need for hepatitis C screening test    Hep C screen today      Relevant Orders    Hepatitis C antibody   HYPOKALEMIA    Pt has missed about a week of potassium because she ran out  Refilled this  Expect K may be low today  States she has occ cramps       Relevant Orders   Comprehensive metabolic panel

## 2016-01-11 NOTE — Progress Notes (Signed)
Pre visit review using our clinic review tool, if applicable. No additional management support is needed unless otherwise documented below in the visit note. 

## 2016-01-11 NOTE — Assessment & Plan Note (Signed)
Hiv screen today

## 2016-01-11 NOTE — Patient Instructions (Addendum)
Consider getting some videos for walking and other exercise like yoga  Get back to regular daily exercise  I think you need a mammogram every year - don't forget to schedule it (you can schedule that yourself)  Labs today  Take care of yourself !

## 2016-01-11 NOTE — Assessment & Plan Note (Signed)
bp in fair control at this time  BP Readings from Last 1 Encounters:  01/11/16 118/78   No changes needed Disc lifstyle change with low sodium diet and exercise   Labs today  Wt loss and exercise encouraged

## 2016-01-11 NOTE — Assessment & Plan Note (Signed)
Pt has missed about a week of potassium because she ran out  Refilled this  Expect K may be low today  States she has occ cramps

## 2016-01-12 ENCOUNTER — Other Ambulatory Visit: Payer: Self-pay | Admitting: Family Medicine

## 2016-01-12 DIAGNOSIS — Z1231 Encounter for screening mammogram for malignant neoplasm of breast: Secondary | ICD-10-CM

## 2016-01-12 LAB — HIV ANTIBODY (ROUTINE TESTING W REFLEX): HIV 1&2 Ab, 4th Generation: NONREACTIVE

## 2016-01-12 LAB — HEPATITIS C ANTIBODY: HCV Ab: NEGATIVE

## 2016-01-14 ENCOUNTER — Ambulatory Visit
Admission: RE | Admit: 2016-01-14 | Discharge: 2016-01-14 | Disposition: A | Discharge status: Home / Self Care | Payer: BLUE CROSS/BLUE SHIELD | Source: Ambulatory Visit | Attending: Family Medicine | Admitting: Family Medicine

## 2016-01-14 DIAGNOSIS — Z1231 Encounter for screening mammogram for malignant neoplasm of breast: Secondary | ICD-10-CM | POA: Insufficient documentation

## 2016-01-14 LAB — HM MAMMOGRAPHY

## 2016-01-18 ENCOUNTER — Other Ambulatory Visit (INDEPENDENT_AMBULATORY_CARE_PROVIDER_SITE_OTHER): Payer: BLUE CROSS/BLUE SHIELD

## 2016-01-18 DIAGNOSIS — R8290 Unspecified abnormal findings in urine: Secondary | ICD-10-CM | POA: Diagnosis not present

## 2016-01-18 DIAGNOSIS — N952 Postmenopausal atrophic vaginitis: Secondary | ICD-10-CM | POA: Diagnosis not present

## 2016-01-18 DIAGNOSIS — R7989 Other specified abnormal findings of blood chemistry: Secondary | ICD-10-CM

## 2016-01-18 DIAGNOSIS — B977 Papillomavirus as the cause of diseases classified elsewhere: Secondary | ICD-10-CM | POA: Diagnosis not present

## 2016-01-18 DIAGNOSIS — N888 Other specified noninflammatory disorders of cervix uteri: Secondary | ICD-10-CM | POA: Diagnosis not present

## 2016-01-18 DIAGNOSIS — R87810 Cervical high risk human papillomavirus (HPV) DNA test positive: Secondary | ICD-10-CM | POA: Diagnosis not present

## 2016-01-18 LAB — POC URINALSYSI DIPSTICK (AUTOMATED)
BILIRUBIN UA: NEGATIVE
Blood, UA: 25
Glucose, UA: NEGATIVE
Ketones, UA: NEGATIVE
NITRITE UA: NEGATIVE
Protein, UA: NEGATIVE
Spec Grav, UA: >=1.03
UROBILINOGEN UA: 0.2
pH, UA: 6

## 2016-01-20 LAB — URINE CULTURE: Organism ID, Bacteria: NO GROWTH

## 2016-03-18 ENCOUNTER — Telehealth: Payer: Self-pay

## 2016-03-18 NOTE — Telephone Encounter (Signed)
Pt left note requesting refill lisinopril HCTZ to [facility] [address]. [facility] but left v/m for pt per DPR to contact [facility] [address] who should have available refill.

## 2016-04-20 ENCOUNTER — Telehealth: Payer: Self-pay

## 2016-04-20 NOTE — Telephone Encounter (Signed)
Pt left note requesting refill K; spoke with Judeth Corn[facility]Stephanie at [facility] [address] and pt has available refills and will get ready for pt. Pt voiced understanding,.

## 2016-05-16 ENCOUNTER — Other Ambulatory Visit: Payer: Self-pay | Admitting: Family Medicine

## 2016-05-16 DIAGNOSIS — Z1231 Encounter for screening mammogram for malignant neoplasm of breast: Secondary | ICD-10-CM

## 2016-05-18 ENCOUNTER — Ambulatory Visit
Admission: RE | Admit: 2016-05-18 | Discharge: 2016-05-18 | Disposition: A | Discharge status: Home / Self Care | Payer: BLUE CROSS/BLUE SHIELD | Source: Ambulatory Visit | Attending: Family Medicine | Admitting: Family Medicine

## 2016-05-18 DIAGNOSIS — Z1231 Encounter for screening mammogram for malignant neoplasm of breast: Secondary | ICD-10-CM

## 2016-06-15 ENCOUNTER — Telehealth: Payer: Self-pay

## 2016-06-15 NOTE — Telephone Encounter (Signed)
Pt left v/m requesting refill BP med; I spoke with Venancio PoissonGemesha at [facility] [address] and pt has available refills for lisinopril HCTZ and Gemesha will get ready for pick up in couple of hrs. Left v/m per DPR to ck with pharmacy later today.

## 2016-09-21 ENCOUNTER — Ambulatory Visit: Payer: Self-pay | Admitting: Medical

## 2016-09-21 VITALS — BP 118/74 | HR 91 | Temp 98.0°F | Wt 174.6 lb

## 2016-09-21 DIAGNOSIS — R21 Rash and other nonspecific skin eruption: Secondary | ICD-10-CM

## 2016-09-21 MED ORDER — PREDNISONE 10 MG (21) PO TBPK: ORAL_TABLET | ORAL | 0 refills | Status: DC

## 2016-09-21 NOTE — Progress Notes (Signed)
   Subjective:    Patient ID: Kristin Barton, female    DOB: 1955-11-22, 61 y.o.   MRN: [id number]  HPI  61 yo female complains of back itching  since beginning of  August.  Patient tried Calamine lotion and "some cream" but does not recall the name, " nothing worked". Thought it may have been fabric sheets ( new) that may have caused itching, Stopped fabric dryer sheets but still continued to itch. Using same soap and no changes in lotions. Denies being outside. Complains of it causing her whole body to itch now. Denies being outside.   Review of Systems  Constitutional: Negative for chills and fatigue.  HENT: Negative for congestion, rhinorrhea and sore throat.   Eyes: Negative for discharge and itching.  Respiratory: Negative for cough.   Cardiovascular: Negative for chest pain.  Gastrointestinal: Negative for abdominal pain.  Genitourinary: Negative for dysuria.  Musculoskeletal: Negative for myalgias.  Skin: Positive for rash.  Neurological: Negative for dizziness and syncope.  Psychiatric/Behavioral: Negative for agitation, behavioral problems and confusion.       Objective:   Physical Exam  Constitutional: She is oriented to person, place, and time. She appears well-developed and well-nourished.  HENT:  Head: Normocephalic and atraumatic.  Eyes: Pupils are equal, round, and reactive to light. Conjunctivae and EOM are normal.  Neck: Normal range of motion. Neck supple.  Neurological: She is alert and oriented to person, place, and time.  Skin: Skin is warm and dry.  Psychiatric: She has a normal mood and affect. Her behavior is normal. Judgment and thought content normal.  Nursing note and vitals reviewed.    Fine maculopapular rash on lower back , excoriations noted.No sign of infection. No sign of rash on upper arms or legs.     Assessment & Plan:  Uticaria , Rash on Back. E-prescribed Prednisone  Taper  10 mg  X  6 days.#21 no refills.Take with food. OTC Zyrtec 10  mg daily for itching,  Oatmeal baths for itching or Aveeno oatmeal baths. Lukewarm showers , not hot.  Return  3- 5 if not improving. Patient cannot pick up medication till Friday when she gets paid.  Given  Benadryl 3 tablets to take one prior to bedtime lot [id number], exp 12-2017.  Noted she works 3rd shift.

## 2016-09-21 NOTE — Patient Instructions (Addendum)
Take otc Zyrtec for itching , take as directed.    Rash A rash is a change in the color of the skin. A rash can also change the way your skin feels. There are many different conditions and factors that can cause a rash. Follow these instructions at home: Pay attention to any changes in your symptoms. Follow these instructions to help with your condition: Medicine Take or apply over-the-counter and prescription medicines only as told by your doctor. These may include:  Corticosteroid cream.  Anti-itch lotions.  Oral antihistamines.  Skin Care  Put cool compresses on the affected areas.  Try taking a bath with: ? Epsom salts. Follow the instructions on the packaging. You can get these at your local pharmacy or grocery store. ? Baking soda. Pour a small amount into the bath as told by your doctor. ? Colloidal oatmeal. Follow the instructions on the packaging. You can get this at your local pharmacy or grocery store.  Try putting baking soda paste onto your skin. Stir water into baking soda until it gets like a paste.  Do not scratch or rub your skin.  Avoid covering the rash. Make sure the rash is exposed to air as much as possible. General instructions  Avoid hot showers or baths, which can make itching worse. A cold shower may help.  Avoid scented soaps, detergents, and perfumes. Use gentle soaps, detergents, perfumes, and other cosmetic products.  Avoid anything that causes your rash. Keep a journal to help track what causes your rash. Write down: ? What you eat. ? What cosmetic products you use. ? What you drink. ? What you wear. This includes jewelry.  Keep all follow-up visits as told by your doctor. This is important. Contact a doctor if:  You sweat at night.  You lose weight.  You pee (urinate) more than normal.  You feel weak.  You throw up (vomit).  Your skin or the whites of your eyes look yellow (jaundice).  Your skin: ? Tingles. ? Is numb.  Your  rash: ? Does not go away after a few days. ? Gets worse.  You are: ? More thirsty than normal. ? More tired than normal.  You have: ? New symptoms. ? Pain in your belly (abdomen). ? A fever. ? Watery poop (diarrhea). Get help right away if:  Your rash covers all or most of your body. The rash may or may not be painful.  You have blisters that: ? Are on top of the rash. ? Grow larger. ? Grow together. ? Are painful. ? Are inside your nose or mouth.  You have a rash that: ? Looks like purple pinprick-sized spots all over your body. ? Has a "bull's eye" or looks like a target. ? Is red and painful, causes your skin to peel, and is not from being in the sun too long. This information is not intended to replace advice given to you by your health care provider. Make sure you discuss any questions you have with your health care provider. Document Released: 07/06/2007 Document Revised: 06/25/2015 Document Reviewed: 06/04/2014 Elsevier Interactive Patient Education  2018 [employer].

## 2016-10-28 ENCOUNTER — Ambulatory Visit: Payer: Self-pay | Admitting: Adult Health

## 2016-10-28 VITALS — BP 120/84 | HR 87 | Temp 97.9°F | Wt 178.0 lb

## 2016-10-28 DIAGNOSIS — R7989 Other specified abnormal findings of blood chemistry: Secondary | ICD-10-CM

## 2016-10-28 DIAGNOSIS — R21 Rash and other nonspecific skin eruption: Secondary | ICD-10-CM

## 2016-10-28 DIAGNOSIS — L299 Pruritus, unspecified: Secondary | ICD-10-CM

## 2016-10-28 DIAGNOSIS — B353 Tinea pedis: Secondary | ICD-10-CM

## 2016-10-28 DIAGNOSIS — B356 Tinea cruris: Secondary | ICD-10-CM

## 2016-10-28 LAB — CBC WITH DIFFERENTIAL/PLATELET
BASOS ABS: 0 10*3/uL (ref 0.0–0.2)
Basos: 0 %
EOS (ABSOLUTE): 0.2 10*3/uL (ref 0.0–0.4)
Eos: 3 %
Hematocrit: 34.3 % (ref 34.0–46.6)
Hemoglobin: 11.7 g/dL (ref 11.1–15.9)
Immature Grans (Abs): 0 10*3/uL (ref 0.0–0.1)
Immature Granulocytes: 0 %
LYMPHS ABS: 1.8 10*3/uL (ref 0.7–3.1)
Lymphs: 35 %
MCH: 30.9 pg (ref 26.6–33.0)
MCHC: 34.1 g/dL (ref 31.5–35.7)
MCV: 91 fL (ref 79–97)
Monocytes Absolute: 0.4 10*3/uL (ref 0.1–0.9)
Monocytes: 8 %
NEUTROS ABS: 2.6 10*3/uL (ref 1.4–7.0)
Neutrophils: 54 %
PLATELETS: 229 10*3/uL (ref 150–379)
RBC: 3.79 x10E6/uL (ref 3.77–5.28)
RDW: 14.6 % (ref 12.3–15.4)
WBC: 5 10*3/uL (ref 3.4–10.8)

## 2016-10-28 LAB — COMPREHENSIVE METABOLIC PANEL
A/G RATIO: 1.8 (ref 1.2–2.2)
ALBUMIN: 4.2 g/dL (ref 3.6–4.8)
ALK PHOS: 60 IU/L (ref 39–117)
ALT: 17 IU/L (ref 0–32)
AST: 24 IU/L (ref 0–40)
BUN / CREAT RATIO: 17 (ref 12–28)
BUN: 16 mg/dL (ref 8–27)
Bilirubin Total: <0.2 mg/dL (ref 0.0–1.2)
CO2: 29 mmol/L (ref 20–29)
Calcium: 9.2 mg/dL (ref 8.7–10.3)
Chloride: 102 mmol/L (ref 96–106)
Creatinine, Ser: 0.96 mg/dL (ref 0.57–1.00)
GFR calc non Af Amer: 64 mL/min/{1.73_m2} (ref >59)
GFR, EST AFRICAN AMERICAN: 74 mL/min/{1.73_m2} (ref >59)
GLUCOSE: 77 mg/dL (ref 65–99)
Globulin, Total: 2.4 g/dL (ref 1.5–4.5)
POTASSIUM: 3.5 mmol/L (ref 3.5–5.2)
Sodium: 144 mmol/L (ref 134–144)
Total Protein: 6.6 g/dL (ref 6.0–8.5)

## 2016-10-28 MED ORDER — MICONAZOLE NITRATE 2 % EX CREA: 1.0000 "application " | TOPICAL_CREAM | Freq: Two times a day (BID) | CUTANEOUS | 1 refills | Status: DC

## 2016-10-28 NOTE — Progress Notes (Signed)
Recent Results (from the past 2160 hour(s))  CBC w/Diff     Status: None   Collection Time: 10/28/16  8:32 AM  Result Value Ref Range   WBC 5.0 3.4 - 10.8 x10E3/uL   RBC 3.79 3.77 - 5.28 x10E6/uL   Hemoglobin 11.7 11.1 - 15.9 g/dL   Hematocrit 34.3 34.0 - 46.6 %   MCV 91 79 - 97 fL   MCH 30.9 26.6 - 33.0 pg   MCHC 34.1 31.5 - 35.7 g/dL   RDW 14.6 12.3 - 15.4 %   Platelets 229 150 - 379 x10E3/uL   Neutrophils 54 Not Estab. %   Lymphs 35 Not Estab. %   Monocytes 8 Not Estab. %   Eos 3 Not Estab. %   Basos 0 Not Estab. %   Neutrophils Absolute 2.6 1.4 - 7.0 x10E3/uL   Lymphocytes Absolute 1.8 0.7 - 3.1 x10E3/uL   Monocytes Absolute 0.4 0.1 - 0.9 x10E3/uL   EOS (ABSOLUTE) 0.2 0.0 - 0.4 x10E3/uL   Basophils Absolute 0.0 0.0 - 0.2 x10E3/uL   Immature Granulocytes 0 Not Estab. %   Immature Grans (Abs) 0.0 0.0 - 0.1 x10E3/uL  Comp Met (CMET)     Status: None   Collection Time: 10/28/16  8:32 AM  Result Value Ref Range   Glucose 77 65 - 99 mg/dL   BUN 16 8 - 27 mg/dL   Creatinine, Ser 0.96 0.57 - 1.00 mg/dL   GFR calc non Af Amer 64 >59 mL/min/1.73   GFR calc Af Amer 74 >59 mL/min/1.73   BUN/Creatinine Ratio 17 12 - 28   Sodium 144 134 - 144 mmol/L   Potassium 3.5 3.5 - 5.2 mmol/L   Chloride 102 96 - 106 mmol/L   CO2 29 20 - 29 mmol/L   Calcium 9.2 8.7 - 10.3 mg/dL   Total Protein 6.6 6.0 - 8.5 g/dL   Albumin 4.2 3.6 - 4.8 g/dL   Globulin, Total 2.4 1.5 - 4.5 g/dL   Albumin/Globulin Ratio 1.8 1.2 - 2.2   Bilirubin Total <0.2 0.0 - 1.2 mg/dL   Alkaline Phosphatase 60 39 - 117 IU/L   AST 24 0 - 40 IU/L   ALT 17 0 - 32 IU/L    Patient called 10/28/16 lab results are normal as above.  She was advised the prescription was sent to the pharmacy but is is over the counter and the pharmacist will help help her find. She will pick up Antifungal foot powder/ spray for her shoes to clean and spray. She can get new shoes if able.  She is advised to add over the counter Claritin per  package instructions  daily.  Patient verbalized understanding of instructions and denies any further questions at this time.

## 2016-10-28 NOTE — Progress Notes (Addendum)
Subjective:     Patient ID: Kristin Barton, female   DOB: Oct 29, 1955, 61 y.o.   MRN: [id number]  HPI  Patient is  61 year old female who presents to the clinic  in no acute distress with bilateral itching of feet and itching of her back. She reports itching between her thighs as well. She reports the itching has improved mildly on her back with the prednisone.  Benadryl helps ease off  itching as well.  She denies any exposure to any new medications/prescriptions or Over the counter.  She was seen on 09/21/16 for back itching and given prednisone and is using Hydrocortisone over the counter.  She does report she has changed all of her soaps and detergents to " sensitive skin". She did not notice a difference after doing all of this change.  She denies any recent illness, fevers, chills, nausea or vomiting.  She denies any ill exposures.  She denies any other symptoms.  Vitals:   10/28/16 0818  Weight: 178 lb (80.7 kg)    Blood pressure 120/84, pulse 87, temperature 97.9 F (36.6 C), temperature source Tympanic, weight 178 lb (80.7 kg), SpO2 98 %. Patient Active Problem List   Diagnosis Date Noted  . Need for hepatitis C screening test 01/11/2016  . Screening for HIV (human immunodeficiency virus) 01/11/2016  . Obesity 07/01/2014  . Abnormal urinalysis 06/18/2014  . Elevated serum creatinine 06/17/2014  . Syncope 11/22/2013  . Leg cramp 07/05/2013  . Other screening mammogram 08/23/2010  . Special screening for malignant neoplasms, colon 08/23/2010  . Routine general medical examination at a health care facility 08/17/2010  . HYPOKALEMIA 03/12/2009  . HYPERTENSION, BENIGN ESSENTIAL 01/22/2009     Current Outpatient Prescriptions:  .  lisinopril-hydrochlorothiazide (PRINZIDE,ZESTORETIC) 10-12.5 MG tablet, Take 1 tablet by mouth daily., Disp: 90 tablet, Rfl: 3 .  Multiple Vitamin (MULTIVITAMIN) tablet, Take 1 tablet by mouth daily.  , Disp: , Rfl:  .  potassium chloride (K-DUR) 10  MEQ tablet, Take 1 tablet (10 mEq total) by mouth 2 (two) times daily., Disp: 180 tablet, Rfl: 3 .  predniSONE (STERAPRED UNI-PAK 21 TAB) 10 MG (21) TBPK tablet, Take 6 tablets by mouth today then  5 tablets tomorrow then one tablet less each day thereafter., Disp: 21 tablet, Rfl: 0   Review of Systems  Constitutional: Negative.   HENT: Negative.   Eyes: Negative.   Respiratory: Negative.   Cardiovascular: Negative.   Gastrointestinal: Negative.   Endocrine: Negative.   Genitourinary: Negative.   Musculoskeletal: Negative.   Skin: Positive for rash (rash bilateral feet and lower back/ bilateral thighs ). Negative for color change, pallor and wound.  Allergic/Immunologic: Negative.   Neurological: Negative.   Hematological: Negative.   Psychiatric/Behavioral: Negative.        Objective:   Physical Exam  Constitutional: She is oriented to person, place, and time. She appears well-developed and well-nourished. No distress.  HENT:  Head: Normocephalic and atraumatic.  Right Ear: External ear normal.  Left Ear: External ear normal.  Nose: Nose normal.  Mouth/Throat: Oropharynx is clear and moist.  Eyes: Pupils are equal, round, and reactive to light. Conjunctivae and EOM are normal. Right eye exhibits no discharge. Left eye exhibits no discharge. No scleral icterus.  Neck: Normal range of motion. Neck supple.  Cardiovascular: Normal rate, regular rhythm, normal heart sounds and intact distal pulses.  Exam reveals no gallop and no friction rub.   No murmur heard. Pulmonary/Chest: Effort normal and breath sounds normal. No  respiratory distress. She has no wheezes. She has no rales. She exhibits no tenderness.  Abdominal: Soft.  Musculoskeletal: Normal range of motion.  Neurological: She is alert and oriented to person, place, and time. She has normal reflexes.  Skin: Skin is warm and dry. Rash noted. No abrasion, no bruising, no burn, no ecchymosis, no laceration, no lesion, no  petechiae and no purpura noted. Rash is maculopapular (slightly raised maculopapular rash scattered throghout bilateral feet where shoes rest/ feet are sweaty / bilateral inner thighs moist with sweat itch with mildly darker macular discoloration in areas of scratching ) and urticarial. Rash is not macular, not papular, not nodular, not pustular and not vesicular. She is not diaphoretic. No cyanosis or erythema. No pallor. Nails show no clubbing.     Areas of rash marked on bilateral feet and thighs- area is sweaty and moist on thighs.   She has scratch marks on her back in area marked with no obvious rash.    Psychiatric: She has a normal mood and affect. Her speech is normal and behavior is normal. Judgment and thought content normal. Cognition and memory are normal.  Vitals reviewed.      Assessment:     Rash - Plan: CBC w/Diff, Comp Met (CMET)  Elevated serum creatinine - Plan: CBC w/Diff, Comp Met (CMET)  Itching - Plan: CBC w/Diff, Comp Met (CMET)  Tinea cruris  Tinea pedis of both feet      Plan:     Return to clinic or [facility], Wynelle Fanny, MD on October 2nd for follow up.   Meds ordered this encounter  Medications  . miconazole (MICOTIN) 2 % cream    Sig: Apply 1 application topically 2 (two) times daily. Apply to both feet/ apply to both inner thighs    Dispense:  28.35 g    Refill:  1  Above medication is Over the counter and recommended for this patient by supervising MD.  Keep areas clean and dry and try to eliminate sweat from sitting on the skin.  Discussed with Dr. Rosanna Randy in agreement with plan above. Will recheck Tuesday here or a [facility], Roque Lias A, MD And then follow up in 2 weeks after that or as needed.  Patient will use tinactin foot powder over the counter in her shoes/  Will prescribe as above and check CBC and CMET now.  Return to clinic at any time  if any new symptoms change, worsen or do not improve. Symptoms should improve  within 72 hours and if not  improving you should call for an appointment at the clinic or be seen in urgent care/ED if clinic is closed. Your symptoms should not get worse from this point forward and if they do seek immediate medical attention. Patient verbalized understanding of instructions and denies any further questions at this time.     Discussed above plan with supervising physician Dr. Miguel Aschoff who is in agreement with the care plan as above.

## 2016-10-28 NOTE — Patient Instructions (Signed)
Athlete's Foot Athlete's foot (tinea pedis) is a fungal infection of the skin on the feet. It often occurs on the skin that is between or underneath the toes. It can also occur on the soles of the feet. The infection can spread from person to person (is contagious). Follow these instructions at home:  Apply or take over-the-counter and prescription medicines only as told by your doctor.  Keep all follow-up visits as told by your doctor. This is important.  Do not scratch your feet.  Keep your feet dry: ? Wear cotton or wool socks. Change your socks every day or if they become wet. ? Wear shoes that allow air to move around, such as sandals or canvas tennis shoes.  Wash and dry your feet: ? Every day or as told by your doctor. ? After exercising. ? Including the area between your toes.  Wear sandals in wet areas, such as locker rooms and shared showers.  Do not share any of these items: ? Towels. ? Nail clippers. ? Other personal items that touch your feet.  If you have diabetes, keep your blood sugar under control. Contact a doctor if:  You have a fever.  You have swelling, soreness, warmth, or redness in your foot.  You are not getting better with treatment.  Your symptoms get worse.  You have new symptoms. This information is not intended to replace advice given to you by your health care provider. Make sure you discuss any questions you have with your health care provider. Document Released: 07/06/2007 Document Revised: 06/25/2015 Document Reviewed: 07/21/2014 Elsevier Interactive Patient Education  2018 [employer] Contact Dermatitis Dermatitis is redness, soreness, and swelling (inflammation) of the skin. Contact dermatitis is a reaction to certain substances that touch the skin. You either touched something that irritated your skin, or you have allergies to something you touched. Follow these instructions at home: Skin Care  Moisturize your skin as  needed.  Apply cool compresses to the affected areas.  Try taking a bath with: ? Epsom salts. Follow the instructions on the package. You can get these at a pharmacy or grocery store. ? Baking soda. Pour a small amount into the bath as told by your doctor. ? Colloidal oatmeal. Follow the instructions on the package. You can get this at a pharmacy or grocery store.  Try applying baking soda paste to your skin. Stir water into baking soda until it looks like paste.  Do not scratch your skin.  Bathe less often.  Bathe in lukewarm water. Avoid using hot water. Medicines  Take or apply over-the-counter and prescription medicines only as told by your doctor.  If you were prescribed an antibiotic medicine, take or apply your antibiotic as told by your doctor. Do not stop taking the antibiotic even if your condition starts to get better. General instructions  Keep all follow-up visits as told by your doctor. This is important.  Avoid the substance that caused your reaction. If you do not know what caused it, keep a journal to try to track what caused it. Write down: ? What you eat. ? What cosmetic products you use. ? What you drink. ? What you wear in the affected area. This includes jewelry.  If you were given a bandage (dressing), take care of it as told by your doctor. This includes when to change and remove it. Contact a doctor if:  You do not get better with treatment.  Your condition gets worse.  You have signs of infection  such as: ? Swelling. ? Tenderness. ? Redness. ? Soreness. ? Warmth.  You have a fever.  You have new symptoms. Get help right away if:  You have a very bad headache.  You have neck pain.  Your neck is stiff.  You throw up (vomit).  You feel very sleepy.  You see red streaks coming from the affected area.  Your bone or joint underneath the affected area becomes painful after the skin has healed.  The affected area turns darker.  You  have trouble breathing. This information is not intended to replace advice given to you by your health care provider. Make sure you discuss any questions you have with your health care provider. Document Released: 11/14/2008 Document Revised: 06/25/2015 Document Reviewed: 06/04/2014 Elsevier Interactive Patient Education  2018 [employer].

## 2016-11-01 ENCOUNTER — Encounter: Payer: Self-pay | Admitting: Medical

## 2016-11-01 ENCOUNTER — Ambulatory Visit: Payer: Self-pay | Admitting: Medical

## 2016-11-01 VITALS — BP 146/92 | HR 81 | Temp 98.0°F | Resp 16 | Ht 63.0 in | Wt 175.0 lb

## 2016-11-01 DIAGNOSIS — B353 Tinea pedis: Secondary | ICD-10-CM

## 2016-11-01 DIAGNOSIS — B356 Tinea cruris: Secondary | ICD-10-CM

## 2016-11-01 NOTE — Patient Instructions (Signed)
  OTC Zyrtec  daily. OTC Benadryl 25 mg at bedtime. Air out feet as much as possible. Return in 2 weeks for recheck sooner if any concerns.    Athlete's Foot Athlete's foot (tinea pedis) is a fungal infection of the skin on the feet. It often occurs on the skin that is between or underneath the toes. It can also occur on the soles of the feet. The infection can spread from person to person (is contagious). Follow these instructions at home:  Apply or take over-the-counter and prescription medicines only as told by your doctor.  Keep all follow-up visits as told by your doctor. This is important.  Do not scratch your feet.  Keep your feet dry: ? Wear cotton or wool socks. Change your socks every day or if they become wet. ? Wear shoes that allow air to move around, such as sandals or canvas tennis shoes.  Wash and dry your feet: ? Every day or as told by your doctor. ? After exercising. ? Including the area between your toes.  Wear sandals in wet areas, such as locker rooms and shared showers.  Do not share any of these items: ? Towels. ? Nail clippers. ? Other personal items that touch your feet.  If you have diabetes, keep your blood sugar under control. Contact a doctor if:  You have a fever.  You have swelling, soreness, warmth, or redness in your foot.  You are not getting better with treatment.  Your symptoms get worse.  You have new symptoms. This information is not intended to replace advice given to you by your health care provider. Make sure you discuss any questions you have with your health care provider. Document Released: 07/06/2007 Document Revised: 06/25/2015 Document Reviewed: 07/21/2014 Elsevier Interactive Patient Education  2018 [employer].

## 2016-11-01 NOTE — Progress Notes (Signed)
   Subjective:    Patient ID: Kristin Barton, female    DOB: Oct 25, 1955, 61 y.o.   MRN: [id number]  HPI  61 yo female in non-acute distress returns today for recheck fro 10/28/2016. Using miconazole cream and spraying foot spray for tinea pedis and tinea cruris (Tolnaftate 1% foot spray).  Using  Jonhnson's  Foot soap with Borax , Iodide and Bran ans she adds in baking soda to help with the itching which gives some relief.Feet are Itching, using OTC benadryl for itching at night. Not taking anything during the day for itching.   Review of Systems  Constitutional: Negative for chills and fever.  HENT: Negative for ear pain and sore throat.   Eyes: Negative for discharge.  Respiratory: Negative for cough and shortness of breath.   Cardiovascular: Negative for chest pain.  Gastrointestinal: Negative for abdominal pain.  Endocrine: Negative for polydipsia, polyphagia and polyuria.  Genitourinary: Negative for dysuria.  Musculoskeletal: Negative for back pain and neck pain.  Skin: Negative for rash.  Allergic/Immunologic: Negative for environmental allergies and food allergies.  Neurological: Negative for dizziness, syncope and light-headedness.  Hematological: Negative for adenopathy.  Psychiatric/Behavioral: Negative for behavioral problems, self-injury and suicidal ideas.         Objective:   Physical Exam  Constitutional: She is oriented to person, place, and time. She appears well-developed and well-nourished.  HENT:  Head: Normocephalic and atraumatic.  Eyes: Pupils are equal, round, and reactive to light. Conjunctivae and EOM are normal.  Neurological: She is alert and oriented to person, place, and time.  Skin: Skin is warm and dry. Rash noted. There is erythema.  Psychiatric: She has a normal mood and affect. Her behavior is normal. Judgment and thought content normal.  Nursing note and vitals reviewed.    Both feet  are with red scaling patches that are flaking off dorsum  of  feet. Compared to picture from 10/28/2016 that provider Marvell Fuller texted me from previous visit. None on plantar surface of feet .  Maculopapular erythematous rash on dorsum of feet and ankles as well.  Between thighs below groin uticarial with fine maculopapular erythema rash ( few areas) also uticarial and around the umbilicus area no rash noted.  Few maculopapular areas on forearms also noted..did not notice this during her last last visit. Middle of back with itching , some excoriations located Midline of mid- back. No rash noted.    Assessment & Plan:  Tinea pedis and Tinea cruris.Uticaria Return in  2 weeks for recheck. Sooner if any concerns. To start OTC Zyrtec 10 mg every day for daytime itching. Taking Bendryl 25 mg helping itching at night. Works 3rd shift. Can continue foot soaks.Try to keep feet bare as much as possible while at home.  Dry well by patting feet with towel after washing. Patient had been using both miconazole cream and foot spray on feet at the same time. This may be adding irritation. Reviewed with patient she is to use the cream on her feet, back and abdomen and spray in there shoes and on her thigh area.Verbalizes understanding and has no questions at discharge.

## 2016-12-06 ENCOUNTER — Encounter: Payer: Self-pay | Admitting: Family Medicine

## 2016-12-06 ENCOUNTER — Ambulatory Visit: Payer: BLUE CROSS/BLUE SHIELD | Admitting: Family Medicine

## 2016-12-06 DIAGNOSIS — R21 Rash and other nonspecific skin eruption: Secondary | ICD-10-CM | POA: Diagnosis not present

## 2016-12-06 DIAGNOSIS — L299 Pruritus, unspecified: Secondary | ICD-10-CM

## 2016-12-06 MED ORDER — PERMETHRIN 5 % EX CREA: 1.0000 "application " | TOPICAL_CREAM | Freq: Once | CUTANEOUS | 0 refills | Status: AC

## 2016-12-06 MED ORDER — HYDROXYZINE HCL 10 MG PO TABS: 10.0000 mg | ORAL_TABLET | Freq: Three times a day (TID) | ORAL | 0 refills | Status: DC | PRN

## 2016-12-06 MED ORDER — METHYLPREDNISOLONE ACETATE 80 MG/ML IJ SUSP
80.0000 mg | Freq: Once | INTRAMUSCULAR | Status: AC
  Administered 2016-12-06: 80 mg via INTRAMUSCULAR

## 2016-12-06 NOTE — Patient Instructions (Addendum)
Use atarax for itching as needed. Hold benadryl if using.  Complete treatment for scabies, permethrin.. Can take 1-4 weeks to stop itching completely. You were given a steroid  injection today.   Scabies, Adult Scabies is a skin condition that happens when very small insects get under the skin (infestation). This causes a rash and severe itchiness. Scabies can spread from person to person (is contagious). If you get scabies, it is common for others in your household to get scabies too. With proper treatment, symptoms usually go away in 2-4 weeks. Scabies usually does not cause lasting problems. What are the causes? This condition is caused by mites (Sarcoptes scabiei, or human itch mites) that can only be seen with a microscope. The mites get into the top layer of skin and lay eggs. Scabies can spread from person to person through:  Close contact with a person who has scabies.  Contact with infested items, such as towels, bedding, or clothing.  What increases the risk? This condition is more likely to develop in:  People who live in nursing homes and other extended-care facilities.  People who have sexual contact with a partner who has scabies.  Young children who attend child care facilities.  People who care for others who are at increased risk for scabies.  What are the signs or symptoms? Symptoms of this condition may include:  Severe itchiness. This is often worse at night.  A rash that includes tiny red bumps or blisters. The rash commonly occurs on the wrist, elbow, armpit, fingers, waist, groin, or buttocks. Bumps may form a line (burrow) in some areas.  Skin irritation. This can include scaly patches or sores.  How is this diagnosed? This condition is diagnosed with a physical exam. Your health care provider will look closely at your skin. In some cases, your health care provider may take a sample of your affected skin (skin scraping) and have it examined under a  microscope. How is this treated? This condition may be treated with:  Medicated cream or lotion that kills the mites. This is spread on the entire body and left on for several hours. Usually, one treatment with medicated cream or lotion is enough to kill all of the mites. In severe cases, the treatment may be repeated.  Medicated cream that relieves itching.  Medicines that help to relieve itching.  Medicines that kill the mites. This treatment is rarely used.  Follow these instructions at home:  Medicines  Take or apply over-the-counter and prescription medicines as told by your health care provider.  Apply medicated cream or lotion as told by your health care provider.  Do not wash off the medicated cream or lotion until the necessary amount of time has passed. Skin Care  Avoid scratching your affected skin.  Keep your fingernails closely trimmed to reduce injury from scratching.  Take cool baths or apply cool washcloths to help reduce itching. General instructions  Clean all items that you recently had contact with, including bedding, clothing, and furniture. Do this on the same day that your treatment starts. ? Use hot water when you wash items. ? Place unwashable items into closed, airtight plastic bags for at least 3 days. The mites cannot live for more than 3 days away from human skin. ? Vacuum furniture and mattresses that you use.  Make sure that other people who may have been infested are examined by a health care provider. These include members of your household and anyone who may have had  contact with infested items.  Keep all follow-up visits as told by your health care provider. This is important. Contact a health care provider if:  You have itching that does not go away after 4 weeks of treatment.  You continue to develop new bumps or burrows.  You have redness, swelling, or pain in your rash area after treatment.  You have fluid, blood, or pus coming from  your rash. This information is not intended to replace advice given to you by your health care provider. Make sure you discuss any questions you have with your health care provider. Document Released: 10/08/2014 Document Revised: 06/25/2015 Document Reviewed: 08/19/2014 Elsevier Interactive Patient Education  [employer].

## 2016-12-06 NOTE — Progress Notes (Signed)
   Subjective:    Patient ID: Kristin Barton, female    DOB: 09/09/1955, 61 y.o.   MRN: [id number]  HPI    61 year old female pt of Dr. Royden Purlower's presents with new onset rash x 1 month.  She reports 3 months ago ( 09/21/2016) she started with itching  All over. Rash on back Treat at work occupational  MD with steroid taper. Decreased itching some.  Told to take benadryl    9/28 seen for rash starting on feet.  She thought it may be tinea  Pedis.. Treated with antifungal. Checked CMET, cbc: nml Had new detergent.. But has since.. Using sensitive skin hypoallergenic detergent. No new meds, no new lotions, no new foods.  Now in last weeks noted rash appear on right  And  Torso as well as back.  Still itching all over. No other contacts  with itching.  Has tried johnson's foot soap. Topical benzocaine.    Review of Systems  Constitutional: Negative for fatigue and fever.  HENT: Negative for congestion.   Eyes: Negative for pain.  Respiratory: Negative for cough and shortness of breath.   Cardiovascular: Negative for chest pain, palpitations and leg swelling.  Gastrointestinal: Negative for abdominal pain.  Genitourinary: Negative for dysuria and vaginal bleeding.  Musculoskeletal: Negative for back pain.  Skin: Positive for rash.  Neurological: Negative for syncope, light-headedness and headaches.  Psychiatric/Behavioral: Negative for dysphoric mood.       Objective:   Physical Exam  Constitutional: Vital signs are normal. She appears well-developed and well-nourished. She is cooperative.  Non-toxic appearance. She does not appear ill. No distress.  HENT:  Head: Normocephalic.  Right Ear: Hearing, tympanic membrane, external ear and ear canal normal. Tympanic membrane is not erythematous, not retracted and not bulging.  Left Ear: Hearing, tympanic membrane, external ear and ear canal normal. Tympanic membrane is not erythematous, not retracted and not bulging.  Nose: No  mucosal edema or rhinorrhea. Right sinus exhibits no maxillary sinus tenderness and no frontal sinus tenderness. Left sinus exhibits no maxillary sinus tenderness and no frontal sinus tenderness.  Mouth/Throat: Uvula is midline, oropharynx is clear and moist and mucous membranes are normal.  Eyes: Conjunctivae, EOM and lids are normal. Pupils are equal, round, and reactive to light. Lids are everted and swept, no foreign bodies found.  Neck: Trachea normal and normal range of motion. Neck supple. Carotid bruit is not present. No thyroid mass and no thyromegaly present.  Cardiovascular: Normal rate, regular rhythm, S1 normal, S2 normal, normal heart sounds, intact distal pulses and normal pulses. Exam reveals no gallop and no friction rub.  No murmur heard. Pulmonary/Chest: Effort normal and breath sounds normal. No tachypnea. No respiratory distress. She has no decreased breath sounds. She has no wheezes. She has no rhonchi. She has no rales.  Abdominal: Soft. Normal appearance and bowel sounds are normal. There is no tenderness.  Neurological: She is alert.  Skin: Skin is warm, dry and intact. No rash noted.  erythematous papules and excoriations in areas pt can reach with hands.  Psychiatric: Her speech is normal and behavior is normal. Judgment and thought content normal. Her mood appears not anxious. Cognition and memory are normal. She does not exhibit a depressed mood.          Assessment & Plan:

## 2016-12-28 ENCOUNTER — Other Ambulatory Visit: Payer: Self-pay | Admitting: Family Medicine

## 2016-12-29 ENCOUNTER — Telehealth: Payer: Self-pay | Admitting: Family Medicine

## 2016-12-29 DIAGNOSIS — Z1231 Encounter for screening mammogram for malignant neoplasm of breast: Secondary | ICD-10-CM | POA: Insufficient documentation

## 2016-12-29 NOTE — Telephone Encounter (Signed)
Pt walked in today.  She wanted to get [facility],  They told her they would need to contact you before scheduling.  Pt has not see you within a year of last mammogram  Can you put order in for screen mammogram or do you want pt to come in to see you before mammogram

## 2016-12-29 NOTE — Telephone Encounter (Signed)
Called patient and LVM that her MMG order was placed by Dr Milinda Antisower. Patient can call herself to schedule.

## 2016-12-29 NOTE — Telephone Encounter (Signed)
Order is done  [facility]

## 2016-12-29 NOTE — Addendum Note (Signed)
Addended by: Roxy MannsWER, MARNE A on: 12/29/2016 08:35 AM   Modules accepted: Orders

## 2017-01-02 ENCOUNTER — Encounter: Payer: Self-pay | Admitting: Family Medicine

## 2017-01-02 NOTE — Assessment & Plan Note (Signed)
Given severe puritis.. Concerning for scabies. Treat with Elimite. Use atarax for itching as needed. Hold benadryl if using.  Given steroid injection   As well.

## 2017-01-16 ENCOUNTER — Encounter: Payer: Self-pay | Admitting: Family Medicine

## 2017-01-16 ENCOUNTER — Ambulatory Visit (INDEPENDENT_AMBULATORY_CARE_PROVIDER_SITE_OTHER): Payer: BLUE CROSS/BLUE SHIELD | Admitting: Family Medicine

## 2017-01-16 VITALS — BP 144/86 | HR 79 | Temp 98.7°F | Ht 63.0 in | Wt 179.5 lb

## 2017-01-16 DIAGNOSIS — R21 Rash and other nonspecific skin eruption: Secondary | ICD-10-CM

## 2017-01-16 DIAGNOSIS — L299 Pruritus, unspecified: Secondary | ICD-10-CM | POA: Diagnosis not present

## 2017-01-16 MED ORDER — PERMETHRIN 5 % EX CREA: 1.0000 "application " | TOPICAL_CREAM | Freq: Once | CUTANEOUS | 0 refills | Status: AC

## 2017-01-16 MED ORDER — HYDROXYZINE HCL 10 MG PO TABS
10.0000 mg | ORAL_TABLET | Freq: Three times a day (TID) | ORAL | 0 refills | Status: DC | PRN
Start: 2017-01-16 — End: 2018-03-09

## 2017-01-16 NOTE — Progress Notes (Signed)
Subjective:    Patient ID: Kristin Barton, female    DOB: [birth year], [age] y.o.   MRN: [id number]  HPI Here for itching /? Bites   She was seen by Dr Ermalene SearingBedsole in November and tx for scabies Severe itching and papules (scratched in areas where she can reach Was given atarax for itching as well and a steroid injection  No new exp that she knows of   She also had monistat cream for feet - helped some but then she developed rash up her legs and all over  One spot is not worse than other  Tried oatmeal bath also-not helpful    Per pt SHE NEVER GOT THE SCABIES MEDICINE  ALSO NEVER GOT THE ATARAX   She did not improve at all (none)    She says her kids/grandkids who live with her now have bites/rash as well   Wt Readings from Last 3 Encounters:  01/16/17 179 lb 8 oz (81.4 kg)  12/06/16 182 lb 4 oz (82.7 kg)  11/01/16 175 lb (79.4 kg)   BP Readings from Last 3 Encounters:  01/16/17 (!) 144/86  12/06/16 (!) 168/98  11/01/16 (!) 146/92   There is some bruising in the area she scratches hard  Patient Active Problem List   Diagnosis Date Noted  . Encounter for screening mammogram for breast cancer 12/29/2016  . Severe itching 12/06/2016  . Rash 12/06/2016  . Need for hepatitis C screening test 01/11/2016  . Screening for HIV (human immunodeficiency virus) 01/11/2016  . Obesity 07/01/2014  . Abnormal urinalysis 06/18/2014  . Elevated serum creatinine 06/17/2014  . Syncope 11/22/2013  . Leg cramp 07/05/2013  . Other screening mammogram 08/23/2010  . Special screening for malignant neoplasms, colon 08/23/2010  . Routine general medical examination at a health care facility 08/17/2010  . HYPOKALEMIA 03/12/2009  . HYPERTENSION, BENIGN ESSENTIAL 01/22/2009   Past Medical History:  Diagnosis Date  . Hypertension   . Hypokalemia    Past Surgical History:  Procedure Laterality Date  . CESAREAN SECTION  1987  . PARTIAL HYSTERECTOMY     for cyst, has one ovary remaining (no  hx of abn pap smears)  . TUBAL LIGATION     Social History   Tobacco Use  . Smoking status: Never Smoker  . Smokeless tobacco: Never Used  Substance Use Topics  . Alcohol use: No    Alcohol/week: 0.0 oz  . Drug use: No   Family History  Problem Relation Age of Onset  . Hypertension Mother   . Asthma Father   . COPD Father        cigars  . Lung cancer Father   . Breast cancer Sister   . Diabetes Sister        mild  . Other Unknown        ? heart problems- distant family  . Mental illness Other        ? in family   No Known Allergies Current Outpatient Medications on File Prior to Visit  Medication Sig Dispense Refill  . lisinopril-hydrochlorothiazide (PRINZIDE,ZESTORETIC) 10-12.5 MG tablet Take 1 tablet by mouth daily. 90 tablet 3  . Multiple Vitamin (MULTIVITAMIN) tablet Take 1 tablet by mouth daily.      . potassium chloride (K-DUR) 10 MEQ tablet Take 1 tablet (10 mEq total) by mouth 2 (two) times daily. 180 tablet 3   No current facility-administered medications on file prior to visit.     Review of Systems  Constitutional: Negative  for activity change, appetite change, fatigue, fever and unexpected weight change.  HENT: Negative for congestion, ear pain, rhinorrhea, sinus pressure and sore throat.   Eyes: Negative for pain, redness and visual disturbance.  Respiratory: Negative for cough, shortness of breath and wheezing.   Cardiovascular: Negative for chest pain and palpitations.  Gastrointestinal: Negative for abdominal pain, blood in stool, constipation and diarrhea.  Endocrine: Negative for polydipsia and polyuria.  Genitourinary: Negative for dysuria, frequency and urgency.  Musculoskeletal: Negative for arthralgias, back pain and myalgias.  Skin: Positive for rash. Negative for pallor.  Allergic/Immunologic: Negative for environmental allergies.  Neurological: Negative for dizziness, syncope and headaches.  Hematological: Negative for adenopathy. Does not  bruise/bleed easily.  Psychiatric/Behavioral: Negative for decreased concentration and dysphoric mood. The patient is not nervous/anxious.        Objective:   Physical Exam  Constitutional: She appears well-developed and well-nourished. No distress.  Pt is uncomfortable from itching    HENT:  Head: Normocephalic and atraumatic.  Mouth/Throat: Oropharynx is clear and moist.  No mouth or throat swelling No rash on face or scalp  Eyes: Conjunctivae and EOM are normal. Pupils are equal, round, and reactive to light. Right eye exhibits no discharge. Left eye exhibits no discharge. No scleral icterus.  Neck: Normal range of motion. Neck supple.  Cardiovascular: Normal rate and regular rhythm.  Lymphadenopathy:    She has no cervical adenopathy.  Neurological: She is alert. No cranial nerve deficit.  Skin: Skin is warm and dry. Rash noted.  Scattered areas of rash with small papules and scale on trunk and extremities Notable on hands/feet (toe web spaces) Also groin and axillae    Psychiatric: She has a normal mood and affect.          Assessment & Plan:   Problem List Items Addressed This Visit      Musculoskeletal and Integument   Rash    Strongly concerning for scabies  Pt states at last visit she was not aware of any px sent in to the pharmacy so she never got them (did get a steroid shot) We reviewed tx plan and [facility]   (reviewed plan in great detail and AVS given with inst) Also given handout on scabies Enc her to get family tx (pets if necessary) and wash linens in hot water Update if not starting to improve in a week or if worsening   Can repeat permethrin in 2 wk if needed  If no further imp consider derm ref         Other   Severe itching - Primary    Strongly concerning for scabies  Pt states at last visit she was not aware of any px sent in to the pharmacy so she never got them (did get a steroid shot) We  reviewed tx plan and [facility]   (reviewed plan in great detail and AVS given with inst) Also given handout on scabies Enc her to get family tx (pets if necessary) and wash linens in hot water Update if not starting to improve in a week or if worsening   Can repeat permethrin in 2 wk if needed  If no further imp consider derm ref

## 2017-01-16 NOTE — Assessment & Plan Note (Signed)
Strongly concerning for scabies  Pt states at last visit she was not aware of any px sent in to the pharmacy so she never got them (did get a steroid shot) We reviewed tx plan and [facility]   (reviewed plan in great detail and AVS given with inst) Also given handout on scabies Enc her to get family tx (pets if necessary) and wash linens in hot water Update if not starting to improve in a week or if worsening   Can repeat permethrin in 2 wk if needed  If no further imp consider derm ref

## 2017-01-16 NOTE — Assessment & Plan Note (Signed)
Strongly concerning for scabies  Pt states at last visit she was not aware of any px sent in to the pharmacy so she never got them (did get a steroid shot) We reviewed tx plan and permethrin and atarax were sent to [place] [facility]   (reviewed plan in great detail and AVS given with inst) Also given handout on scabies Enc her to get family tx (pets if necessary) and wash linens in hot water Update if not starting to improve in a week or if worsening   Can repeat permethrin in 2 wk if needed  If no further imp consider derm ref  

## 2017-01-16 NOTE — Patient Instructions (Signed)
I think you may have scabies ( mite infection- here is a handout on it)  Use the cream as directed (permethrin)  Also the pills as needed just to help itching  Keep cool -  (heat makes itch worse)   The rest of your family needs to be treated Pets also if needed  Wash linens in hot water   I SENT PX TO THE [facility] [address]

## 2017-02-01 ENCOUNTER — Encounter: Payer: Self-pay | Admitting: Family Medicine

## 2017-02-01 ENCOUNTER — Ambulatory Visit (INDEPENDENT_AMBULATORY_CARE_PROVIDER_SITE_OTHER): Payer: BLUE CROSS/BLUE SHIELD | Admitting: Family Medicine

## 2017-02-01 ENCOUNTER — Ambulatory Visit
Admission: RE | Admit: 2017-02-01 | Discharge: 2017-02-01 | Disposition: A | Discharge status: Home / Self Care | Payer: BLUE CROSS/BLUE SHIELD | Source: Ambulatory Visit | Attending: Family Medicine | Admitting: Family Medicine

## 2017-02-01 VITALS — BP 132/85 | HR 76 | Temp 98.4°F | Ht 63.0 in | Wt 177.5 lb

## 2017-02-01 DIAGNOSIS — E6609 Other obesity due to excess calories: Secondary | ICD-10-CM | POA: Diagnosis not present

## 2017-02-01 DIAGNOSIS — Z6832 Body mass index (BMI) 32.0-32.9, adult: Secondary | ICD-10-CM

## 2017-02-01 DIAGNOSIS — I10 Essential (primary) hypertension: Secondary | ICD-10-CM

## 2017-02-01 DIAGNOSIS — E876 Hypokalemia: Secondary | ICD-10-CM | POA: Diagnosis not present

## 2017-02-01 DIAGNOSIS — R21 Rash and other nonspecific skin eruption: Secondary | ICD-10-CM

## 2017-02-01 DIAGNOSIS — Z1231 Encounter for screening mammogram for malignant neoplasm of breast: Secondary | ICD-10-CM

## 2017-02-01 LAB — TSH: TSH: 2.67 u[IU]/mL (ref 0.35–4.50)

## 2017-02-01 LAB — COMPREHENSIVE METABOLIC PANEL
ALK PHOS: 52 U/L (ref 39–117)
ALT: 17 U/L (ref 0–35)
AST: 19 U/L (ref 0–37)
Albumin: 4.5 g/dL (ref 3.5–5.2)
BUN: 22 mg/dL (ref 6–23)
CHLORIDE: 100 meq/L (ref 96–112)
CO2: 31 mEq/L (ref 19–32)
Calcium: 9.7 mg/dL (ref 8.4–10.5)
Creatinine, Ser: 1.13 mg/dL (ref 0.40–1.20)
GFR: 62.88 mL/min (ref >60.00)
GLUCOSE: 86 mg/dL (ref 70–99)
POTASSIUM: 3.5 meq/L (ref 3.5–5.1)
Sodium: 140 mEq/L (ref 135–145)
TOTAL PROTEIN: 7.6 g/dL (ref 6.0–8.3)
Total Bilirubin: 0.3 mg/dL (ref 0.2–1.2)

## 2017-02-01 LAB — CBC WITH DIFFERENTIAL/PLATELET
BASOS PCT: 0.5 % (ref 0.0–3.0)
Basophils Absolute: 0 10*3/uL (ref 0.0–0.1)
EOS PCT: 4 % (ref 0.0–5.0)
Eosinophils Absolute: 0.2 10*3/uL (ref 0.0–0.7)
HCT: 38.8 % (ref 36.0–46.0)
Hemoglobin: 12.9 g/dL (ref 12.0–15.0)
LYMPHS ABS: 2 10*3/uL (ref 0.7–4.0)
Lymphocytes Relative: 34.3 % (ref 12.0–46.0)
MCHC: 33.2 g/dL (ref 30.0–36.0)
MCV: 92.8 fl (ref 78.0–100.0)
MONOS PCT: 8 % (ref 3.0–12.0)
Monocytes Absolute: 0.5 10*3/uL (ref 0.1–1.0)
NEUTROS PCT: 53.2 % (ref 43.0–77.0)
Neutro Abs: 3.1 10*3/uL (ref 1.4–7.7)
Platelets: 233 10*3/uL (ref 150.0–400.0)
RBC: 4.18 Mil/uL (ref 3.87–5.11)
RDW: 13.7 % (ref 11.5–15.5)
WBC: 5.9 10*3/uL (ref 4.0–10.5)

## 2017-02-01 LAB — LIPID PANEL
Cholesterol: 199 mg/dL (ref 0–200)
HDL: 64.1 mg/dL (ref >39.00)
LDL Cholesterol: 123 mg/dL — ABNORMAL HIGH (ref 0–99)
NONHDL: 135.17
Total CHOL/HDL Ratio: 3
Triglycerides: 61 mg/dL (ref 0.0–149.0)
VLDL: 12.2 mg/dL (ref 0.0–40.0)

## 2017-02-01 MED ORDER — PERMETHRIN 5 % EX CREA: 1.0000 "application " | TOPICAL_CREAM | Freq: Once | CUTANEOUS | 0 refills | Status: AC

## 2017-02-01 MED ORDER — POTASSIUM CHLORIDE ER 10 MEQ PO TBCR: 10.0000 meq | EXTENDED_RELEASE_TABLET | Freq: Two times a day (BID) | ORAL | 3 refills | Status: DC

## 2017-02-01 MED ORDER — LISINOPRIL-HYDROCHLOROTHIAZIDE 10-12.5 MG PO TABS: 1.0000 | ORAL_TABLET | Freq: Every day | ORAL | 3 refills | Status: DC

## 2017-02-01 NOTE — Assessment & Plan Note (Signed)
From [facility]ct[facility]  Refilled K  Lab today

## 2017-02-01 NOTE — Progress Notes (Signed)
Subjective:    Patient ID: Kristin Barton, female    DOB: 12-18-1955, 62 y.o.   MRN: [id number]  HPI Here for f/u of chronic health problems   Wt Readings from Last 3 Encounters:  02/01/17 177 lb 8 oz (80.5 kg)  01/16/17 179 lb 8 oz (81.4 kg)  12/06/16 182 lb 4 oz (82.7 kg)   31.44 kg/m   bp is mildly elevated today  No cp or palpitations or headaches or edema  No side effects to medicines  BP Readings from Last 3 Encounters:  02/01/17 138/90  01/16/17 (!) 144/86  12/06/16 (!) 168/98    bp was elevated last time and the visit before   Takes lisinopril/hct  Also K dur for hypokalemia   She is much much improved from her itching/ rash  Treated for her scabies  She has enough to re treat if needed  Oatmeal baths are helpful too  The hydroxyzine made her sleepy  Washed linens and kids in the house were treated  Staying cool helps also    Lab Results  Component Value Date   CREATININE 0.96 10/28/2016   BUN 16 10/28/2016   NA 144 10/28/2016   K 3.5 10/28/2016   CL 102 10/28/2016   CO2 29 10/28/2016   Lab Results  Component Value Date   ALT 17 10/28/2016   AST 24 10/28/2016   ALKPHOS 60 10/28/2016   BILITOT <0.2 10/28/2016   Lab Results  Component Value Date   CHOL 196 01/11/2016   HDL 73.40 01/11/2016   LDLCALC 103 (H) 01/11/2016   TRIG 98.0 01/11/2016   CHOLHDL 3 01/11/2016   Due for a cholesterol profile   She is taking care of herself  Diet -avoids fatty foods and salty food  Cannot walk outside like she used to since moving to [place]   Declines flu shots  Just had her mammogram this am -pending results   Patient Active Problem List   Diagnosis Date Noted  . Encounter for screening mammogram for breast cancer 12/29/2016  . Rash 12/06/2016  . Need for hepatitis C screening test 01/11/2016  . Screening for HIV (human immunodeficiency virus) 01/11/2016  . Obesity 07/01/2014  . Syncope 11/22/2013  . Other screening mammogram 08/23/2010  .  Special screening for malignant neoplasms, colon 08/23/2010  . Routine general medical examination at a health care facility 08/17/2010  . HYPOKALEMIA 03/12/2009  . HYPERTENSION, BENIGN ESSENTIAL 01/22/2009   Past Medical History:  Diagnosis Date  . Hypertension   . Hypokalemia    Past Surgical History:  Procedure Laterality Date  . CESAREAN SECTION  1987  . PARTIAL HYSTERECTOMY     for cyst, has one ovary remaining (no hx of abn pap smears)  . TUBAL LIGATION     Social History   Tobacco Use  . Smoking status: Never Smoker  . Smokeless tobacco: Never Used  Substance Use Topics  . Alcohol use: No    Alcohol/week: 0.0 oz  . Drug use: No   Family History  Problem Relation Age of Onset  . Hypertension Mother   . Asthma Father   . COPD Father        cigars  . Lung cancer Father   . Breast cancer Sister   . Diabetes Sister        mild  . Breast cancer Sister   . Other Unknown        ? heart problems- distant family  . Mental illness Other        ?  in family   No Known Allergies Current Outpatient Medications on File Prior to Visit  Medication Sig Dispense Refill  . hydrOXYzine (ATARAX/VISTARIL) 10 MG tablet Take 1 tablet (10 mg total) by mouth 3 (three) times daily as needed for itching (for symptoms only). Caution of sedation/sleepiness 30 tablet 0  . Multiple Vitamin (MULTIVITAMIN) tablet Take 1 tablet by mouth daily.       No current facility-administered medications on file prior to visit.      Review of Systems  Constitutional: Negative for activity change, appetite change, fatigue, fever and unexpected weight change.  HENT: Negative for congestion, ear pain, rhinorrhea, sinus pressure and sore throat.   Eyes: Negative for pain, redness and visual disturbance.  Respiratory: Negative for cough, shortness of breath and wheezing.   Cardiovascular: Negative for chest pain and palpitations.  Gastrointestinal: Negative for abdominal pain, blood in stool,  constipation and diarrhea.  Endocrine: Negative for polydipsia and polyuria.  Genitourinary: Negative for dysuria, frequency and urgency.  Musculoskeletal: Negative for arthralgias, back pain and myalgias.  Skin: Negative for pallor and rash.  Allergic/Immunologic: Negative for environmental allergies.  Neurological: Negative for dizziness, syncope and headaches.  Hematological: Negative for adenopathy. Does not bruise/bleed easily.  Psychiatric/Behavioral: Negative for decreased concentration and dysphoric mood. The patient is not nervous/anxious.        Objective:   Physical Exam  Constitutional: She appears well-developed and well-nourished. No distress.  obese and well appearing   HENT:  Head: Normocephalic and atraumatic.  Mouth/Throat: Oropharynx is clear and moist.  Eyes: Conjunctivae and EOM are normal. Pupils are equal, round, and reactive to light.  Neck: Normal range of motion. Neck supple. No JVD present. Carotid bruit is not present. No thyromegaly present.  Cardiovascular: Normal rate, regular rhythm, normal heart sounds and intact distal pulses. Exam reveals no gallop.  Pulmonary/Chest: Effort normal and breath sounds normal. No respiratory distress. She has no wheezes. She has no rales.  No crackles  Abdominal: Soft. Bowel sounds are normal. She exhibits no distension, no abdominal bruit and no mass. There is no tenderness.  Musculoskeletal: She exhibits no edema.  Lymphadenopathy:    She has no cervical adenopathy.  Neurological: She is alert. She has normal reflexes.  Skin: Skin is warm and dry. No rash noted. No erythema.  Rash has resolved  Few remaining excoriations-healing   Psychiatric: She has a normal mood and affect.          Assessment & Plan:   Problem List Items Addressed This Visit      Cardiovascular and Mediastinum   HYPERTENSION, BENIGN ESSENTIAL - Primary    bp in fair control at this time (better on 2nd check) BP Readings from Last 1  Encounters:  02/01/17 132/85   No changes needed Disc lifstyle change with low sodium diet and exercise  Labs ordered  DASH eating plan disc and copy given  Enc her to get back to walking -will have to find a safe place near where she lives after moving      Relevant Medications   lisinopril-hydrochlorothiazide (PRINZIDE,ZESTORETIC) 10-12.5 MG tablet   Other Relevant Orders   CBC with Differential/Platelet   Comprehensive metabolic panel   Lipid panel   TSH     Musculoskeletal and Integument   Rash    Overall much improved after tx for susp scabies with permethrin - urged to treat one more time if itching is not gone -refill printed  She has cleaned/washed linens in hot water  as well         Other   HYPOKALEMIA    From hctz  Refilled K  Lab today       Obesity    Discussed how this problem influences overall health and the risks it imposes  Reviewed plan for weight loss with lower calorie diet (via better food choices and also portion control or program like weight watchers) and exercise building up to or more than 30 minutes 5 days per week including some aerobic activity

## 2017-02-01 NOTE — Assessment & Plan Note (Signed)
Discussed how this problem influences overall health and the risks it imposes  Reviewed plan for weight loss with lower calorie diet (via better food choices and also portion control or program like weight watchers) and exercise building up to or more than 30 minutes 5 days per week including some aerobic activity    

## 2017-02-01 NOTE — Patient Instructions (Addendum)
If you want to repeat the scabies treatment one more time to make sure it if done=go ahead (here is a px)   Find some parks in [place] to start walking in (there are quite a few)    Take care of yourself  Blood pressure is improved on 2nd check

## 2017-02-01 NOTE — Assessment & Plan Note (Signed)
Overall much improved after tx for susp scabies with permethrin - urged to treat one more time if itching is not gone -refill printed  She has cleaned/washed linens in hot water as well

## 2017-02-01 NOTE — Assessment & Plan Note (Signed)
bp in fair control at this time (better on 2nd check) BP Readings from Last 1 Encounters:  02/01/17 132/85   No changes needed Disc lifstyle change with low sodium diet and exercise  Labs ordered  DASH eating plan disc and copy given  Enc her to get back to walking -will have to find a safe place near where she lives after moving

## 2017-02-02 ENCOUNTER — Other Ambulatory Visit: Payer: Self-pay | Admitting: Family Medicine

## 2017-02-02 ENCOUNTER — Encounter: Payer: Self-pay | Admitting: *Deleted

## 2017-02-02 DIAGNOSIS — N6489 Other specified disorders of breast: Secondary | ICD-10-CM

## 2017-02-02 DIAGNOSIS — R928 Other abnormal and inconclusive findings on diagnostic imaging of breast: Secondary | ICD-10-CM

## 2017-03-01 ENCOUNTER — Ambulatory Visit
Admission: RE | Admit: 2017-03-01 | Discharge: 2017-03-01 | Disposition: A | Discharge status: Home / Self Care | Payer: BLUE CROSS/BLUE SHIELD | Source: Ambulatory Visit | Attending: Family Medicine | Admitting: Family Medicine

## 2017-03-01 DIAGNOSIS — N6489 Other specified disorders of breast: Secondary | ICD-10-CM | POA: Insufficient documentation

## 2017-03-01 DIAGNOSIS — R928 Other abnormal and inconclusive findings on diagnostic imaging of breast: Secondary | ICD-10-CM

## 2017-03-01 DIAGNOSIS — R922 Inconclusive mammogram: Secondary | ICD-10-CM | POA: Diagnosis not present

## 2017-03-19 ENCOUNTER — Other Ambulatory Visit: Payer: Self-pay

## 2017-03-19 ENCOUNTER — Emergency Department (HOSPITAL_COMMUNITY): Payer: BLUE CROSS/BLUE SHIELD

## 2017-03-19 ENCOUNTER — Emergency Department (HOSPITAL_COMMUNITY)
Admission: EM | Admit: 2017-03-19 | Discharge: 2017-03-19 | Disposition: A | Discharge status: Home / Self Care | Payer: BLUE CROSS/BLUE SHIELD | Attending: Emergency Medicine | Admitting: Emergency Medicine

## 2017-03-19 ENCOUNTER — Encounter (HOSPITAL_COMMUNITY): Payer: Self-pay

## 2017-03-19 DIAGNOSIS — R55 Syncope and collapse: Secondary | ICD-10-CM | POA: Diagnosis not present

## 2017-03-19 DIAGNOSIS — M545 Low back pain: Secondary | ICD-10-CM | POA: Insufficient documentation

## 2017-03-19 DIAGNOSIS — Z79899 Other long term (current) drug therapy: Secondary | ICD-10-CM | POA: Insufficient documentation

## 2017-03-19 DIAGNOSIS — S3992XA Unspecified injury of lower back, initial encounter: Secondary | ICD-10-CM | POA: Diagnosis not present

## 2017-03-19 DIAGNOSIS — R4020 Unspecified coma: Secondary | ICD-10-CM | POA: Diagnosis not present

## 2017-03-19 DIAGNOSIS — I10 Essential (primary) hypertension: Secondary | ICD-10-CM | POA: Insufficient documentation

## 2017-03-19 DIAGNOSIS — S299XXA Unspecified injury of thorax, initial encounter: Secondary | ICD-10-CM | POA: Diagnosis not present

## 2017-03-19 DIAGNOSIS — S8991XA Unspecified injury of right lower leg, initial encounter: Secondary | ICD-10-CM | POA: Diagnosis not present

## 2017-03-19 DIAGNOSIS — M549 Dorsalgia, unspecified: Secondary | ICD-10-CM

## 2017-03-19 DIAGNOSIS — W19XXXA Unspecified fall, initial encounter: Secondary | ICD-10-CM

## 2017-03-19 DIAGNOSIS — M25561 Pain in right knee: Secondary | ICD-10-CM | POA: Diagnosis not present

## 2017-03-19 LAB — BASIC METABOLIC PANEL
Anion gap: 12 (ref 5–15)
BUN: 16 mg/dL (ref 6–20)
CO2: 24 mmol/L (ref 22–32)
Calcium: 9.6 mg/dL (ref 8.9–10.3)
Chloride: 104 mmol/L (ref 101–111)
Creatinine, Ser: 1.34 mg/dL — ABNORMAL HIGH (ref 0.44–1.00)
GFR calc Af Amer: 48 mL/min — ABNORMAL LOW (ref >60)
GFR calc non Af Amer: 42 mL/min — ABNORMAL LOW (ref >60)
Glucose, Bld: 149 mg/dL — ABNORMAL HIGH (ref 65–99)
Potassium: 3.7 mmol/L (ref 3.5–5.1)
Sodium: 140 mmol/L (ref 135–145)

## 2017-03-19 LAB — CBC
HCT: 41.6 % (ref 36.0–46.0)
Hemoglobin: 13.8 g/dL (ref 12.0–15.0)
MCH: 30.8 pg (ref 26.0–34.0)
MCHC: 33.2 g/dL (ref 30.0–36.0)
MCV: 92.9 fL (ref 78.0–100.0)
Platelets: 242 10*3/uL (ref 150–400)
RBC: 4.48 MIL/uL (ref 3.87–5.11)
RDW: 13.3 % (ref 11.5–15.5)
WBC: 7.4 10*3/uL (ref 4.0–10.5)

## 2017-03-19 LAB — CBG MONITORING, ED: Glucose-Capillary: 124 mg/dL — ABNORMAL HIGH (ref 65–99)

## 2017-03-19 MED ORDER — IBUPROFEN 600 MG PO TABS: 600.0000 mg | ORAL_TABLET | Freq: Three times a day (TID) | ORAL | 0 refills | Status: DC | PRN

## 2017-03-19 MED ORDER — IBUPROFEN 400 MG PO TABS
600.0000 mg | ORAL_TABLET | Freq: Once | ORAL | Status: AC
  Administered 2017-03-19: 600 mg via ORAL
  Filled 2017-03-19: qty 1

## 2017-03-19 NOTE — ED Triage Notes (Signed)
Patient reports that after coming out of church had syncopal event. Alert and oriented on arrival, complains of back pain only. No CP, no SOB. Had not eaten lunch, NAD

## 2017-03-19 NOTE — ED Notes (Signed)
Patient to go to CT after X-Ray.

## 2017-03-19 NOTE — ED Provider Notes (Signed)
[facility] Provider Note   CSN: [id number] Arrival date & time: 03/19/17  1437     History   Chief Complaint Chief Complaint  Patient presents with  . Loss of Consciousness    HPI Kristin Barton is a 62 y.o. female.  HPI  62 year old female history of hypertension compliant with her medications does not take antiplatelet/anti-coagulation therapy had a witnessed loss of consciousness there was brief.  Patient was seated at the time with no prodromal symptoms awoke on the floor with subsequent emesis x1.  Patient denies any recent illness, fevers, chest pain, shortness of breath, prior history of congestive heart failure or syncopal episodes.  Patient is cognitively/functionally at baseline.  Patient does complain of lower back discomfort along with right knee pain secondary to fall.  Patient denies any paresthesia, paralysis or muscular weakness.  Patient has not ambulated since the fall.  Past Medical History:  Diagnosis Date  . Hypertension   . Hypokalemia     Patient Active Problem List   Diagnosis Date Noted  . Encounter for screening mammogram for breast cancer 12/29/2016  . Rash 12/06/2016  . Need for hepatitis C screening test 01/11/2016  . Screening for HIV (human immunodeficiency virus) 01/11/2016  . Obesity 07/01/2014  . Syncope 11/22/2013  . Other screening mammogram 08/23/2010  . Special screening for malignant neoplasms, colon 08/23/2010  . Routine general medical examination at a health care facility 08/17/2010  . HYPOKALEMIA 03/12/2009  . HYPERTENSION, BENIGN ESSENTIAL 01/22/2009    Past Surgical History:  Procedure Laterality Date  . CESAREAN SECTION  1987  . PARTIAL HYSTERECTOMY     for cyst, has one ovary remaining (no hx of abn pap smears)  . TUBAL LIGATION      OB History    Gravida Para Term Preterm AB Living   1 1           SAB TAB Ectopic Multiple Live Births                  Obstetric Comments   1st Menstrual Cycle:  10 1st Pregnancy:  29       Home Medications    Prior to Admission medications   Medication Sig Start Date End Date Taking? Authorizing Provider  hydrOXYzine (ATARAX/VISTARIL) 10 MG tablet Take 1 tablet (10 mg total) by mouth 3 (three) times daily as needed for itching (for symptoms only). Caution of sedation/sleepiness 01/16/17   Tower, Audrie GallusMarne A, MD  ibuprofen (ADVIL,MOTRIN) 600 MG tablet Take 1 tablet (600 mg total) by mouth every 8 (eight) hours as needed. 03/19/17   Jaynie CollinsAugustin, Njeri Vicente, DO  lisinopril-hydrochlorothiazide (PRINZIDE,ZESTORETIC) 10-12.5 MG tablet Take 1 tablet by mouth daily. 02/01/17   Tower, Audrie GallusMarne A, MD  Multiple Vitamin (MULTIVITAMIN) tablet Take 1 tablet by mouth daily.      [provider]  potassium chloride (K-DUR) 10 MEQ tablet Take 1 tablet (10 mEq total) by mouth 2 (two) times daily. 02/01/17   Tower, Audrie GallusMarne A, MD    Family History Family History  Problem Relation Age of Onset  . Hypertension Mother   . Asthma Father   . COPD Father        cigars  . Lung cancer Father   . Breast cancer Sister   . Diabetes Sister        mild  . Breast cancer Sister   . Other Unknown        ? heart problems- distant family  . Mental  illness Other        ? in family    Social History Social History   Tobacco Use  . Smoking status: Never Smoker  . Smokeless tobacco: Never Used  Substance Use Topics  . Alcohol use: No    Alcohol/week: 0.0 oz  . Drug use: No     Allergies   Patient has no known allergies.   Review of Systems Review of Systems  Review of Systems  Constitutional: Negative for fever and chills.  HENT: Negative for ear pain, sore throat and trouble swallowing.   Eyes: Negative for pain and visual disturbance.  Respiratory: Negative for cough and shortness of breath.   Cardiovascular: Negative for chest pain and leg swelling.  Gastrointestinal: Negative for nausea, vomiting, abdominal pain and diarrhea.  Genitourinary:  Negative for dysuria, urgency and frequency.  Musculoskeletal: see HPI Skin: Negative for rash and wound.  Neurological: see HPI; + syncope   Physical Exam Updated Vital Signs BP 121/76   Pulse 70   Temp 97.9 F (36.6 C) (Oral)   Resp 13   Ht 5\' 3"  (1.6 m)   Wt 78 kg (172 lb)   SpO2 95%   BMI 30.47 kg/m   Physical Exam  Physical Exam Vitals:   03/19/17 2100 03/19/17 2109  BP: 121/76   Pulse: 70   Resp: 13   Temp:  97.9 F (36.6 C)  SpO2: 95%    Constitutional: Patient is in no acute distress Head: Normocephalic and atraumatic.  Eyes: Extraocular motion intact, no scleral icterus Neck: Supple without meningismus, mass, or overt JVD Respiratory: Effort normal and breath sounds normal. No respiratory distress. CV: Heart regular rate and rhythm, no obvious murmurs.  Pulses +2 and symmetric Abdomen: Soft, non-tender, non-distended MSK: Negative C-spine tenderness, positive tenderness palpation at T10 through L2 midline no evidence of step-offs; upper extremity/lower extremities full active range of motion with muscular strength 5 out of 5 neurovascularly intact.  Right knee positive tenderness potation at suprapatellar.  No evidence of deformity and no evidence of ligamentous injury.  Mild swelling. Skin: Warm, dry, intact Neuro: Alert and oriented, no motor deficit noted; cranial nerves II through XII intact negative pronator drift  Psychiatric: Mood and affect are normal.  ED Treatments / Results  Labs (all labs ordered are listed, but only abnormal results are displayed) Labs Reviewed  BASIC METABOLIC PANEL - Abnormal; Notable for the following components:      Result Value   Glucose, Bld 149 (*)    Creatinine, Ser 1.34 (*)    GFR calc non Af Amer 42 (*)    GFR calc Af Amer 48 (*)    All other components within normal limits  CBG MONITORING, ED - Abnormal; Notable for the following components:   Glucose-Capillary 124 (*)    All other components within normal  limits  CBC    EKG  EKG Interpretation  Date/Time:  Sunday March 19 2017 15:07:00 EST Ventricular Rate:  85 PR Interval:  158 QRS Duration: 72 QT Interval:  348 QTC Calculation: 414 R Axis:   80 Text Interpretation:  Normal sinus rhythm Possible Left atrial enlargement Borderline ECG No acute changes Nonspecific ST and T wave abnormality Confirmed by Derwood Kaplan ([id number]) on 03/19/2017 7:57:19 PM       Radiology Dg Thoracic Spine W/swimmers  Result Date: 03/19/2017 CLINICAL DATA:  Larey Seat off bar stool, back pain EXAM: THORACIC SPINE - 3 VIEWS COMPARISON:  None. FINDINGS: There are 11 rib pairs.  Thoracic alignment is within normal limits except for minimal scoliosis. Vertebral body heights are maintained. Mild degenerative disc space narrowing and osteophyte involving the mid to lower thoracic spine. IMPRESSION: Degenerative changes.  No acute osseous abnormality. Electronically Signed   By: Jasmine Pang M.D.   On: 03/19/2017 19:50   Dg Lumbar Spine Complete  Result Date: 03/19/2017 CLINICAL DATA:  Fall EXAM: LUMBAR SPINE - COMPLETE 4+ VIEW COMPARISON:  None. FINDINGS: Lumbar alignment is within normal limits. The vertebral body heights are normal. Moderate degenerative changes at L5-S1 with mild degenerative changes at L1-L2, L2-L3 and L3-L4. IMPRESSION: Degenerative changes.  No acute osseous abnormality. Electronically Signed   By: Jasmine Pang M.D.   On: 03/19/2017 19:49   Ct Head Wo Contrast  Result Date: 03/19/2017 CLINICAL DATA:  Altered level of consciousness. EXAM: CT HEAD WITHOUT CONTRAST TECHNIQUE: Contiguous axial images were obtained from the base of the skull through the vertex without intravenous contrast. COMPARISON:  11/15/2013 FINDINGS: Brain: There is no evidence of acute infarct, intracranial hemorrhage, mass, midline shift, or extra-axial fluid collection. The ventricles and sulci are normal for age. Vascular: Calcified atherosclerosis at the skull base. No  hyperdense vessel. Skull: No fracture or focal osseous lesion. Sinuses/Orbits: Mild-to-moderate right maxillary sinus mucosal thickening. Clear mastoid air cells. Unremarkable orbits. Other: None. IMPRESSION: No evidence of acute intracranial abnormality. Unremarkable CT appearance of the brain. Electronically Signed   By: Sebastian Ache M.D.   On: 03/19/2017 20:07   Dg Knee Complete 4 Views Right  Result Date: 03/19/2017 CLINICAL DATA:  Larey Seat off bar stool anterior knee pain EXAM: RIGHT KNEE - COMPLETE 4+ VIEW COMPARISON:  None. FINDINGS: No acute displaced fracture or malalignment is seen. Mild degenerative changes involving the lateral and patellofemoral compartments. No large effusion. IMPRESSION: Mild degenerative changes.  No acute osseous abnormality. Electronically Signed   By: Jasmine Pang M.D.   On: 03/19/2017 19:47    Procedures Procedures (including critical care time)  Medications Ordered in ED Medications  ibuprofen (ADVIL,MOTRIN) tablet 600 mg (600 mg Oral Given 03/19/17 1834)     Initial Impression / Assessment and Plan / ED Course  I have reviewed the triage vital signs and the nursing notes.  Pertinent labs & imaging results that were available during my care of the patient were reviewed by me and considered in my medical decision making (see chart for details).     62 year old female history of hypertension compliant with her medications does not take antiplatelet/anti-coagulation therapy had a witnessed loss of consciousness there was brief.  Patient was seated at the time with no prodromal symptoms awoke on the floor with subsequent emesis x1.  Patient denies any recent illness, fevers, chest pain, shortness of breath, prior history of congestive heart failure or syncopal episodes.  Patient is cognitively/functionally at baseline.  Patient does complain of lower back discomfort along with right knee pain secondary to fall.  Patient denies any paresthesia, paralysis or  muscular weakness.  Patient has not ambulated since the fall.  Patient arrives here medically stable well-appearing.  Physical exam as annotated above with no focal deficits on neuro exam.  Positive tenderness to palpation to right knee without evidence of loss of integrity/instability.  Review of x-ray shows no evidence of acute fracture/dislocation.  CT headWithout acute findings.  Review of T/L-spine without evidence of acute fracture/dislocation.  Patient is low risk with [facility] criteria/chest rules.  Suspect isolated syncopal event doubt ACS, doubt PE, doubt infectious etiology.  Review  of labs shows no evidence of leukocytosis, stable H&H, negative electrolyte imbalance, mild renal insufficiency creatinine 1.34.  Plan for outpatient follow-up to primary care provider, good return precautions will be discharged home.  Final Clinical Impressions(s) / ED Diagnoses   Final diagnoses:  Syncope, unspecified syncope type  Back pain, unspecified back location, unspecified back pain laterality, unspecified chronicity  Acute pain of right knee    ED Discharge Orders        Ordered    ibuprofen (ADVIL,MOTRIN) 600 MG tablet  Every 8 hours PRN     03/19/17 2028       Jaynie Collins, DO 03/20/17 1133    Derwood Kaplan, MD 03/21/17 1218

## 2017-03-22 ENCOUNTER — Ambulatory Visit: Payer: BLUE CROSS/BLUE SHIELD | Admitting: Family Medicine

## 2017-03-22 ENCOUNTER — Encounter: Payer: Self-pay | Admitting: Family Medicine

## 2017-03-22 VITALS — BP 140/74 | HR 85 | Temp 98.0°F | Ht 63.0 in | Wt 182.5 lb

## 2017-03-22 DIAGNOSIS — R21 Rash and other nonspecific skin eruption: Secondary | ICD-10-CM

## 2017-03-22 DIAGNOSIS — M25561 Pain in right knee: Secondary | ICD-10-CM | POA: Insufficient documentation

## 2017-03-22 DIAGNOSIS — R55 Syncope and collapse: Secondary | ICD-10-CM

## 2017-03-22 DIAGNOSIS — M545 Low back pain, unspecified: Secondary | ICD-10-CM

## 2017-03-22 DIAGNOSIS — R7989 Other specified abnormal findings of blood chemistry: Secondary | ICD-10-CM

## 2017-03-22 NOTE — Patient Instructions (Addendum)
Use ice on your knee whenever you can  Wear the brace when up and around   If you feel faint or dizzy-sit on the floor immediately   Keep walking to help back pain   We will refer you to cardiology  Also to dermatology for rash   Let's keep you out of work until Sunday   Lab today for kidney function-keep drinking fluids

## 2017-03-22 NOTE — Progress Notes (Signed)
Subjective:    Patient ID: Kristin Barton, female    DOB: 12-25-1955, 62 y.o.   MRN: [id number]  HPI Here for f/u of ED visit on 03/19/17 for syncope with fall   This happened at [employer] She exp some dizziness before fainting  No sob/palp or cp   EKG showed NSR rate of 85 (poss L atrial enl)  No acute changes   Lab was reassuring Results for orders placed or performed during the hospital encounter of 03/19/17  Basic metabolic panel  Result Value Ref Range   Sodium 140 135 - 145 mmol/L   Potassium 3.7 3.5 - 5.1 mmol/L   Chloride 104 101 - 111 mmol/L   CO2 24 22 - 32 mmol/L   Glucose, Bld 149 (H) 65 - 99 mg/dL   BUN 16 6 - 20 mg/dL   Creatinine, Ser 4.09 (H) 0.44 - 1.00 mg/dL   Calcium 9.6 8.9 - 81.1 mg/dL   GFR calc non Af Amer 42 (L) >60 mL/min   GFR calc Af Amer 48 (L) >60 mL/min   Anion gap 12 5 - 15  CBC  Result Value Ref Range   WBC 7.4 4.0 - 10.5 K/uL   RBC 4.48 3.87 - 5.11 MIL/uL   Hemoglobin 13.8 12.0 - 15.0 g/dL   HCT 91.4 78.2 - 95.6 %   MCV 92.9 78.0 - 100.0 fL   MCH 30.8 26.0 - 34.0 pg   MCHC 33.2 30.0 - 36.0 g/dL   RDW 21.3 08.6 - 57.8 %   Platelets 242 150 - 400 K/uL  CBG monitoring, ED  Result Value Ref Range   Glucose-Capillary 124 (H) 65 - 99 mg/dL     Had pain in back and R knee from fall   CT head -nl  Xray of knee and T/L spine no fx   Dg Thoracic Spine W/swimmers  Result Date: 03/19/2017 CLINICAL DATA:  Larey Seat off bar stool, back pain EXAM: THORACIC SPINE - 3 VIEWS COMPARISON:  None. FINDINGS: There are 11 rib pairs. Thoracic alignment is within normal limits except for minimal scoliosis. Vertebral body heights are maintained. Mild degenerative disc space narrowing and osteophyte involving the mid to lower thoracic spine. IMPRESSION: Degenerative changes.  No acute osseous abnormality. Electronically Signed   By: Jasmine Pang M.D.   On: 03/19/2017 19:50   Dg Lumbar Spine Complete  Result Date: 03/19/2017 CLINICAL DATA:  Fall EXAM: LUMBAR  SPINE - COMPLETE 4+ VIEW COMPARISON:  None. FINDINGS: Lumbar alignment is within normal limits. The vertebral body heights are normal. Moderate degenerative changes at L5-S1 with mild degenerative changes at L1-L2, L2-L3 and L3-L4. IMPRESSION: Degenerative changes.  No acute osseous abnormality. Electronically Signed   By: Jasmine Pang M.D.   On: 03/19/2017 19:49   Ct Head Wo Contrast  Result Date: 03/19/2017 CLINICAL DATA:  Altered level of consciousness. EXAM: CT HEAD WITHOUT CONTRAST TECHNIQUE: Contiguous axial images were obtained from the base of the skull through the vertex without intravenous contrast. COMPARISON:  11/15/2013 FINDINGS: Brain: There is no evidence of acute infarct, intracranial hemorrhage, mass, midline shift, or extra-axial fluid collection. The ventricles and sulci are normal for age. Vascular: Calcified atherosclerosis at the skull base. No hyperdense vessel. Skull: No fracture or focal osseous lesion. Sinuses/Orbits: Mild-to-moderate right maxillary sinus mucosal thickening. Clear mastoid air cells. Unremarkable orbits. Other: None. IMPRESSION: No evidence of acute intracranial abnormality. Unremarkable CT appearance of the brain. Electronically Signed   By: Sebastian Ache M.D.   On:  03/19/2017 20:07   Dg Knee Complete 4 Views Right  Result Date: 03/19/2017 CLINICAL DATA:  Larey SeatFell off bar stool anterior knee pain EXAM: RIGHT KNEE - COMPLETE 4+ VIEW COMPARISON:  None. FINDINGS: No acute displaced fracture or malalignment is seen. Mild degenerative changes involving the lateral and patellofemoral compartments. No large effusion. IMPRESSION: Mild degenerative changes.  No acute osseous abnormality. Electronically Signed   By: Jasmine PangKim  Fujinaga M.D.   On: 03/19/2017 19:47   [place] Breast Ltd Uni Left Inc Axilla  Result Date: 03/01/2017 CLINICAL DATA:  Left breast upper outer quadrant focal asymmetry seen on most recent screening mammography. EXAM: 2D DIGITAL DIAGNOSTIC LEFT MAMMOGRAM WITH CAD  AND ADJUNCT TOMO ULTRASOUND LEFT BREAST COMPARISON:  Previous exam(s). ACR Breast Density Category c: The breast tissue is heterogeneously dense, which may obscure small masses. FINDINGS: Mammographically, there are no suspicious masses, areas of architectural distortion or microcalcifications in the left breast. The previously seen focal asymmetry in the upper-outer quadrant of the left breast, posterior depth, effaces to glandular tissue on 3D images, consistent with fibroglandular tissue. Mammographic images were processed with CAD. On physical exam, no suspicious masses are palpated. Targeted ultrasound is performed, showing no suspicious masses or shadowing lesions. IMPRESSION: No mammographic or sonographic evidence of malignancy in the left breast. RECOMMENDATION: Screening mammogram in one year.(Code:SM-B-01Y) I have discussed the findings and recommendations with the patient. Results were also provided in writing at the conclusion of the visit. If applicable, a reminder letter will be sent to the patient regarding the next appointment. BI-RADS CATEGORY  1: Negative. Electronically Signed   By: Ted Mcalpineobrinka  Dimitrova M.D.   On: 03/01/2017 11:33   Mm Diag Breast Tomo Uni Left  Result Date: 03/01/2017 CLINICAL DATA:  Left breast upper outer quadrant focal asymmetry seen on most recent screening mammography. EXAM: 2D DIGITAL DIAGNOSTIC LEFT MAMMOGRAM WITH CAD AND ADJUNCT TOMO ULTRASOUND LEFT BREAST COMPARISON:  Previous exam(s). ACR Breast Density Category c: The breast tissue is heterogeneously dense, which may obscure small masses. FINDINGS: Mammographically, there are no suspicious masses, areas of architectural distortion or microcalcifications in the left breast. The previously seen focal asymmetry in the upper-outer quadrant of the left breast, posterior depth, effaces to glandular tissue on 3D images, consistent with fibroglandular tissue. Mammographic images were processed with CAD. On physical exam,  no suspicious masses are palpated. Targeted ultrasound is performed, showing no suspicious masses or shadowing lesions. IMPRESSION: No mammographic or sonographic evidence of malignancy in the left breast. RECOMMENDATION: Screening mammogram in one year.(Code:SM-B-01Y) I have discussed the findings and recommendations with the patient. Results were also provided in writing at the conclusion of the visit. If applicable, a reminder letter will be sent to the patient regarding the next appointment. BI-RADS CATEGORY  1: Negative. Electronically Signed   By: Ted Mcalpineobrinka  Dimitrova M.D.   On: 03/01/2017 11:33   (breast studies were not part of the ED w/u)  obs for 4 h and d/c home   Today:   Wt Readings from Last 3 Encounters:  03/22/17 182 lb 8 oz (82.8 kg)  03/19/17 172 lb (78 kg)  02/01/17 177 lb 8 oz (80.5 kg)   32.33 kg/m   BP Readings from Last 3 Encounters:  03/22/17 140/74  03/19/17 121/76  02/01/17 132/85   Pulse Readings from Last 3 Encounters:  03/22/17 85  03/19/17 70  02/01/17 76   Is sore in general  Laying around  No more dizziness /no fainting but not doing a lot (just  folding clothes- etc)   Tells story of faint  She was not actually at church- doing a home visit and sitting on a stool  She had eaten breakfast/ felt fine  Felt briefly disoriented and then fainted   Vomited after this - all the way to the hospital in the car  Could not get up after that - sat on the floor until help came   States she has "blacked out" before  First time when she was pregnant in the bathroom  2nd time was 3 y ago leaving the hospital after visiting someone  Last year she fainted - was making up a bed  No loss of continence  No seizure activity -with any of these    She makes an effort to drink lots of fluids   Still having itching  Worse in warm areas / groin areas /abdomen  Atarax helps but makes it very sleepy  Doing oatmeal bath and lotion   Patient Active Problem List     Diagnosis Date Noted  . Right knee pain 03/22/2017  . Lumbar pain 03/22/2017  . Encounter for screening mammogram for breast cancer 12/29/2016  . Rash 12/06/2016  . Need for hepatitis C screening test 01/11/2016  . Screening for HIV (human immunodeficiency virus) 01/11/2016  . Obesity 07/01/2014  . Elevated serum creatinine 06/17/2014  . Syncope 11/22/2013  . Other screening mammogram 08/23/2010  . Special screening for malignant neoplasms, colon 08/23/2010  . Routine general medical examination at a health care facility 08/17/2010  . HYPOKALEMIA 03/12/2009  . HYPERTENSION, BENIGN ESSENTIAL 01/22/2009   Past Medical History:  Diagnosis Date  . Hypertension   . Hypokalemia    Past Surgical History:  Procedure Laterality Date  . CESAREAN SECTION  1987  . PARTIAL HYSTERECTOMY     for cyst, has one ovary remaining (no hx of abn pap smears)  . TUBAL LIGATION     Social History   Tobacco Use  . Smoking status: Never Smoker  . Smokeless tobacco: Never Used  Substance Use Topics  . Alcohol use: No    Alcohol/week: 0.0 oz  . Drug use: No   Family History  Problem Relation Age of Onset  . Hypertension Mother   . Asthma Father   . COPD Father        cigars  . Lung cancer Father   . Breast cancer Sister   . Diabetes Sister        mild  . Breast cancer Sister   . Other Unknown        ? heart problems- distant family  . Mental illness Other        ? in family   No Known Allergies Current Outpatient Medications on File Prior to Visit  Medication Sig Dispense Refill  . hydrOXYzine (ATARAX/VISTARIL) 10 MG tablet Take 1 tablet (10 mg total) by mouth 3 (three) times daily as needed for itching (for symptoms only). Caution of sedation/sleepiness 30 tablet 0  . ibuprofen (ADVIL,MOTRIN) 600 MG tablet Take 1 tablet (600 mg total) by mouth every 8 (eight) hours as needed. 30 tablet 0  . lisinopril-hydrochlorothiazide (PRINZIDE,ZESTORETIC) 10-12.5 MG tablet Take 1 tablet by mouth  daily. 90 tablet 3  . Multiple Vitamin (MULTIVITAMIN) tablet Take 1 tablet by mouth daily.      . potassium chloride (K-DUR) 10 MEQ tablet Take 1 tablet (10 mEq total) by mouth 2 (two) times daily. 180 tablet 3   No current facility-administered medications on file prior  to visit.      Review of Systems  Constitutional: Negative for activity change, appetite change, fatigue, fever and unexpected weight change.  HENT: Negative for congestion, ear pain, rhinorrhea, sinus pressure and sore throat.   Eyes: Negative for pain, redness and visual disturbance.  Respiratory: Negative for cough, shortness of breath and wheezing.   Cardiovascular: Negative for chest pain and palpitations.  Gastrointestinal: Negative for abdominal pain, blood in stool, constipation and diarrhea.  Endocrine: Negative for polydipsia and polyuria.  Genitourinary: Negative for dysuria, frequency and urgency.  Musculoskeletal: Negative for arthralgias, back pain and myalgias.  Skin: Negative for pallor and rash.  Allergic/Immunologic: Negative for environmental allergies.  Neurological: Negative for dizziness, tremors, seizures, syncope, speech difficulty, weakness, light-headedness, numbness and headaches.       No episodes of syncope since the last   Hematological: Negative for adenopathy. Does not bruise/bleed easily.  Psychiatric/Behavioral: Negative for decreased concentration and dysphoric mood. The patient is not nervous/anxious.        Objective:   Physical Exam  Constitutional: She appears well-developed and well-nourished. No distress.  overwt and well app  HENT:  Head: Normocephalic and atraumatic.  Mouth/Throat: Oropharynx is clear and moist.  No head trauma   Eyes: Conjunctivae and EOM are normal. Pupils are equal, round, and reactive to light. Right eye exhibits no discharge. Left eye exhibits no discharge. No scleral icterus.  Neck: Normal range of motion. Neck supple. No JVD present. Carotid bruit  is not present. No thyromegaly present.  Nl rom of neck  Cardiovascular: Normal rate, regular rhythm, normal heart sounds and intact distal pulses. Exam reveals no gallop.  Pulmonary/Chest: Effort normal and breath sounds normal. No respiratory distress. She has no wheezes. She has no rales.  No crackles  Abdominal: Soft. Bowel sounds are normal. She exhibits no distension, no abdominal bruit and no mass. There is no tenderness.  Musculoskeletal: She exhibits tenderness. She exhibits no edema.  LS-some bilat LS muscular tenderness  Nl rom  Neg slr/bent knee raise  Nl rom with discomfort in flex and ext  Nl gait   R knee-ecchymosis and superficial swelling over patella with hematoma but not effusion No crepitus Pain to flex over 90 deg More pain on full ext  Nl mcmurray test / nl drawer/lachman    Lymphadenopathy:    She has no cervical adenopathy.  Neurological: She is alert. She has normal reflexes. She displays no atrophy. No cranial nerve deficit or sensory deficit. She exhibits normal muscle tone. She displays a negative Romberg sign. Coordination and gait normal.  Nl neuro exam  No cerebellar signs   Skin: Skin is warm and dry. No rash noted.  Dry skin  Excoriations over abdomen and thighs No signs of infection No urticaria    Psychiatric: She has a normal mood and affect.  Not anxious  Supportive family present          Assessment & Plan:   Problem List Items Addressed This Visit      Cardiovascular and Mediastinum   Syncope - Primary    Reviewed hospital records, lab results and studies in detail  - ED w/u  Reassuring exam  This is becoming recurrent  Will ref to cardiology for eval  Disc what to do if dizzy- sit or lie on floor and change pos closely Alert if this re occurs       Relevant Orders   Ambulatory referral to Cardiology     Musculoskeletal and Integument  Rash    Ongoing itching with dry skin /sometimes rash No imp after tx for scabies  several times and moisturizers  Antihistamines help some  Due to length of problem- ref to dermatology       Relevant Orders   Ambulatory referral to Dermatology     Other   Elevated serum creatinine    Suspect pt was mildly dehydrated in ED after her syncopal episode and vomiting  Now drinking lots of fluids  Will re check bmet today      Relevant Orders   Basic metabolic panel   Lumbar pain    After a syncopal fall Rev her xr -reassuring  No neuro symptoms  Recommend heat/ice stretching and update       Right knee pain    S/p syncopal fall  Rev xr-reassuring  Ecchymosis over patella with swelling Recommend ice/elevation/knee brace and relative rest Update if not continuing to improve

## 2017-03-23 LAB — BASIC METABOLIC PANEL
BUN: 14 mg/dL (ref 6–23)
CHLORIDE: 100 meq/L (ref 96–112)
CO2: 32 mEq/L (ref 19–32)
Calcium: 9.5 mg/dL (ref 8.4–10.5)
Creatinine, Ser: 1.14 mg/dL (ref 0.40–1.20)
GFR: 62.22 mL/min (ref >60.00)
Glucose, Bld: 142 mg/dL — ABNORMAL HIGH (ref 70–99)
Potassium: 3.2 mEq/L — ABNORMAL LOW (ref 3.5–5.1)
SODIUM: 139 meq/L (ref 135–145)

## 2017-03-23 NOTE — Assessment & Plan Note (Signed)
After a syncopal fall Rev her xr -reassuring  No neuro symptoms  Recommend heat/ice stretching and update

## 2017-03-23 NOTE — Assessment & Plan Note (Signed)
S/p syncopal fall  Rev xr-reassuring  Ecchymosis over patella with swelling Recommend ice/elevation/knee brace and relative rest Update if not continuing to improve

## 2017-03-23 NOTE — Assessment & Plan Note (Signed)
Suspect pt was mildly dehydrated in ED after her syncopal episode and vomiting  Now drinking lots of fluids  Will re check bmet today

## 2017-03-23 NOTE — Assessment & Plan Note (Signed)
Ongoing itching with dry skin /sometimes rash No imp after tx for scabies several times and moisturizers  Antihistamines help some  Due to length of problem- ref to dermatology

## 2017-03-23 NOTE — Assessment & Plan Note (Signed)
Reviewed hospital records, lab results and studies in detail  - ED w/u  Reassuring exam  This is becoming recurrent  Will ref to cardiology for eval  Disc what to do if dizzy- sit or lie on floor and change pos closely Alert if this re occurs

## 2017-03-24 NOTE — Progress Notes (Signed)
Victorio Palmeferring-Marne Tower MD Reason for referral-Syncope  HPI: 62 yo female for evaluation of syncope at request of Roxy MannsMarne Tower MD. Seen for syncope 03/19/17; head CT negative; K 3.2; hgb 13.8.  Patient has had several episodes of syncope in her life.  Her most recent episode occurred at a family member's home.  She was sitting down talking to her relative and developed dizziness followed by syncope.  She fell into the floor.  There was no preceding nausea, diaphoresis, chest pain, dyspnea or palpitations.  She is unclear about duration.  She felt well afterwards.  She otherwise is very active and denies dyspnea on exertion, orthopnea, PND, pedal edema, exertional chest pain.  She does not have palpitations.  Cardiology now asked to evaluate.  Current Outpatient Medications  Medication Sig Dispense Refill  . hydrOXYzine (ATARAX/VISTARIL) 10 MG tablet Take 1 tablet (10 mg total) by mouth 3 (three) times daily as needed for itching (for symptoms only). Caution of sedation/sleepiness 30 tablet 0  . ibuprofen (ADVIL,MOTRIN) 600 MG tablet Take 1 tablet (600 mg total) by mouth every 8 (eight) hours as needed. 30 tablet 0  . lisinopril-hydrochlorothiazide (PRINZIDE,ZESTORETIC) 10-12.5 MG tablet Take 1 tablet by mouth daily. 90 tablet 3  . Multiple Vitamin (MULTIVITAMIN) tablet Take 1 tablet by mouth daily.      . potassium chloride (K-DUR) 10 MEQ tablet Take 1 tablet (10 mEq total) by mouth 2 (two) times daily. 180 tablet 3   No current facility-administered medications for this visit.     No Known Allergies   Past Medical History:  Diagnosis Date  . Abnormal urinalysis 06/18/2014  . Elevated serum creatinine 06/17/2014   With GFR of 53   . Hypertension   . Hypokalemia   . Inflamed skin tag 06/14/2013  . Need for hepatitis C screening test 01/11/2016  . Obesity 07/01/2014    Past Surgical History:  Procedure Laterality Date  . CESAREAN SECTION  1987  . PARTIAL HYSTERECTOMY     for cyst, has  one ovary remaining (no hx of abn pap smears)  . TUBAL LIGATION      Social History   Socioeconomic History  . Marital status: Divorced    Spouse name: Not on file  . Number of children: 1  . Years of education: 8712  . Highest education level: Not on file  Social Needs  . Financial resource strain: Not on file  . Food insecurity - worry: Not on file  . Food insecurity - inability: Not on file  . Transportation needs - medical: Not on file  . Transportation needs - non-medical: Not on file  Occupational History  . Occupation: Manufacturing engineerational Equipt [employer]    Employer: [employer]    Comment: 3rd shift  Tobacco Use  . Smoking status: Never Smoker  . Smokeless tobacco: Never Used  Substance and Sexual Activity  . Alcohol use: No    Alcohol/week: 0.0 oz  . Drug use: No  . Sexual activity: Not on file  Other Topics Concern  . Not on file  Social History Narrative   Regular exercise: walking   Cares for new grandchild          Family History  Problem Relation Age of Onset  . Hypertension Mother   . Asthma Father   . COPD Father        cigars  . Lung cancer Father   . Breast cancer Sister   . Diabetes Sister  mild  . Breast cancer Sister   . Other Unknown        ? heart problems- distant family  . Mental illness Other        ? in family    ROS: no fevers or chills, productive cough, hemoptysis, dysphasia, odynophagia, melena, hematochezia, dysuria, hematuria, rash, seizure activity, orthopnea, PND, pedal edema, claudication. Remaining systems are negative.  Physical Exam:   Blood pressure (!) 158/98, pulse 94, height 5\' 3"  (1.6 m), weight 187 lb (84.8 kg).  General:  Well developed/well nourished in NAD Skin warm/dry Patient not depressed No peripheral clubbing Back-normal HEENT-normal/normal eyelids Neck supple/normal carotid upstroke bilaterally; no bruits; no JVD; no thyromegaly chest - CTA/ normal expansion CV - RRR/normal S1 and S2; no murmurs, rubs  or gallops;  PMI nondisplaced Abdomen -NT/ND, no HSM, no mass, + bowel sounds, no bruit 2+ femoral pulses, no bruits Ext-no edema, chords, 2+ DP Neuro-grossly nonfocal  ECG - 03/19/17-sinus with no ST changes; personally reviewed  A/P  1 syncope-etiology unclear.  Question vagal episode.  We will arrange an echocardiogram to assess LV function.  Schedule 30-day event monitor to exclude arrhythmia.  Patient instructed not to drive for 6 months.  2 hypertension-blood pressure is elevated.  Increase lisinopril HCT to 20/12.5 mg daily.  Check potassium and renal function in 1 week.  3 hypokalemia-recheck potassium in 1 week after adjusting blood pressure medications.  Olga Millers, MD

## 2017-04-03 ENCOUNTER — Encounter: Payer: Self-pay | Admitting: Cardiology

## 2017-04-03 ENCOUNTER — Ambulatory Visit: Payer: BLUE CROSS/BLUE SHIELD | Admitting: Cardiology

## 2017-04-03 VITALS — BP 158/98 | HR 94 | Ht 63.0 in | Wt 187.0 lb

## 2017-04-03 DIAGNOSIS — E876 Hypokalemia: Secondary | ICD-10-CM | POA: Diagnosis not present

## 2017-04-03 DIAGNOSIS — I10 Essential (primary) hypertension: Secondary | ICD-10-CM | POA: Diagnosis not present

## 2017-04-03 DIAGNOSIS — R55 Syncope and collapse: Secondary | ICD-10-CM | POA: Diagnosis not present

## 2017-04-03 MED ORDER — LISINOPRIL-HYDROCHLOROTHIAZIDE 20-12.5 MG PO TABS: 1.0000 | ORAL_TABLET | Freq: Every day | ORAL | 3 refills | Status: DC

## 2017-04-03 NOTE — Patient Instructions (Signed)
Medication Instructions:   INCREASE LISINOPRIL/HCTZ TO 20/12.5 MG ONCE DAILY  Labwork:  Your physician recommends that you return for lab work in: ONE WEEK  Testing/Procedures:  Your physician has requested that you have an echocardiogram. Echocardiography is a painless test that uses sound waves to create images of your heart. It provides your doctor with information about the size and shape of your heart and how well your heart's chambers and valves are working. This procedure takes approximately one hour. There are no restrictions for this procedure.   Your physician has recommended that you wear an event monitor. Event monitors are medical devices that record the heart's electrical activity. Doctors most often us these monitors to diagnose arrhythmias. Arrhythmias are problems with the speed or rhythm of the heartbeat. The monitor is a small, portable device. You can wear one while you do your normal daily activities. This is usually used to diagnose what is causing palpitations/syncope (passing out).    Follow-Up:  Your physician recommends that you schedule a follow-up appointment in: 3 MONTHS WITH DR Jens SomRENSHAW

## 2017-04-05 DIAGNOSIS — B86 Scabies: Secondary | ICD-10-CM | POA: Diagnosis not present

## 2017-04-11 DIAGNOSIS — R55 Syncope and collapse: Secondary | ICD-10-CM | POA: Diagnosis not present

## 2017-04-12 LAB — BASIC METABOLIC PANEL
BUN / CREAT RATIO: 18 (ref 12–28)
BUN: 20 mg/dL (ref 8–27)
CO2: 24 mmol/L (ref 20–29)
Calcium: 9.4 mg/dL (ref 8.7–10.3)
Chloride: 105 mmol/L (ref 96–106)
Creatinine, Ser: 1.09 mg/dL — ABNORMAL HIGH (ref 0.57–1.00)
GFR, EST AFRICAN AMERICAN: 63 mL/min/{1.73_m2} (ref >59)
GFR, EST NON AFRICAN AMERICAN: 55 mL/min/{1.73_m2} — AB (ref >59)
Glucose: 112 mg/dL — ABNORMAL HIGH (ref 65–99)
Potassium: 3.8 mmol/L (ref 3.5–5.2)
Sodium: 146 mmol/L — ABNORMAL HIGH (ref 134–144)

## 2017-04-13 ENCOUNTER — Other Ambulatory Visit: Payer: Self-pay

## 2017-04-13 ENCOUNTER — Ambulatory Visit (HOSPITAL_COMMUNITY): Payer: BLUE CROSS/BLUE SHIELD | Attending: Cardiovascular Disease

## 2017-04-13 ENCOUNTER — Ambulatory Visit (INDEPENDENT_AMBULATORY_CARE_PROVIDER_SITE_OTHER): Payer: BLUE CROSS/BLUE SHIELD

## 2017-04-13 DIAGNOSIS — R55 Syncope and collapse: Secondary | ICD-10-CM | POA: Diagnosis not present

## 2017-04-13 DIAGNOSIS — I10 Essential (primary) hypertension: Secondary | ICD-10-CM | POA: Insufficient documentation

## 2017-04-17 ENCOUNTER — Telehealth: Payer: Self-pay | Admitting: Cardiology

## 2017-04-17 NOTE — Telephone Encounter (Signed)
Patient aware of echo results and verbalized understanding.  

## 2017-04-17 NOTE — Telephone Encounter (Signed)
Follow up    Patient returning call for lab result

## 2017-04-26 DIAGNOSIS — B86 Scabies: Secondary | ICD-10-CM | POA: Diagnosis not present

## 2017-07-19 NOTE — Progress Notes (Signed)
HPI: FU syncope. Seen for syncope 03/19/17; head CT negative; K 3.2; hgb 13.8.   Echocardiogram March 2019 showed normal LV function and moderate diastolic dysfunction.   Monitor March 2019 showed no significant arrhythmia.  Since last seen the patient denies any dyspnea on exertion, orthopnea, PND, pedal edema, palpitations, syncope or chest pain.   Current Outpatient Medications  Medication Sig Dispense Refill  . hydrOXYzine (ATARAX/VISTARIL) 10 MG tablet Take 1 tablet (10 mg total) by mouth 3 (three) times daily as needed for itching (for symptoms only). Caution of sedation/sleepiness 30 tablet 0  . ibuprofen (ADVIL,MOTRIN) 600 MG tablet Take 1 tablet (600 mg total) by mouth every 8 (eight) hours as needed. 30 tablet 0  . lisinopril-hydrochlorothiazide (PRINZIDE,ZESTORETIC) 20-12.5 MG tablet Take 1 tablet by mouth daily. 90 tablet 3  . Multiple Vitamin (MULTIVITAMIN) tablet Take 1 tablet by mouth daily.      . potassium chloride (K-DUR) 10 MEQ tablet Take 1 tablet (10 mEq total) by mouth 2 (two) times daily. 180 tablet 3   No current facility-administered medications for this visit.      Past Medical History:  Diagnosis Date  . Abnormal urinalysis 06/18/2014  . Elevated serum creatinine 06/17/2014   With GFR of 53   . Hypertension   . Hypokalemia   . Inflamed skin tag 06/14/2013  . Need for hepatitis C screening test 01/11/2016  . Obesity 07/01/2014    Past Surgical History:  Procedure Laterality Date  . CESAREAN SECTION  1987  . PARTIAL HYSTERECTOMY     for cyst, has one ovary remaining (no hx of abn pap smears)  . TUBAL LIGATION      Social History   Socioeconomic History  . Marital status: Divorced    Spouse name: Not on file  . Number of children: 1  . Years of education: 43  . Highest education level: Not on file  Occupational History  . 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 Financial resource  strain: Not on file  . Food insecurity:    Worry: Not on file    Inability: Not on file  . Transportation needs:    Medical: Not on file    Non-medical: Not on file  Tobacco Use  . Smoking status: Never Smoker  . Smokeless tobacco: Never Used  Substance and Sexual Activity  . Alcohol use: No    Alcohol/week: 0.0 oz  . Drug use: No  . Sexual activity: Not on file  Lifestyle  . Physical activity:    Days per week: Not on file    Minutes per session: Not on file  . Stress: Not on file  Relationships  . Social connections:    Talks on phone: Not on file    Gets together: Not on file    Attends religious service: Not on file    Active member of club or organization: Not on file    Attends meetings of clubs or organizations: Not on file    Relationship status: Not on file  . Intimate partner violence:    Fear of current or ex partner: Not on file    Emotionally abused: Not on file    Physically abused: Not on file    Forced sexual activity: Not on file  Other Topics Concern  . Not on file  Social History Narrative   Regular exercise: walking   Cares for  new grandchild          Family History  Problem Relation Age of Onset  . Hypertension Mother   . Asthma Father   . COPD Father        cigars  . Lung cancer Father   . Breast cancer Sister   . Diabetes Sister        mild  . Breast cancer Sister   . Other Unknown        ? heart problems- distant family  . Mental illness Other        ? in family    ROS: no fevers or chills, productive cough, hemoptysis, dysphasia, odynophagia, melena, hematochezia, dysuria, hematuria, rash, seizure activity, orthopnea, PND, pedal edema, claudication. Remaining systems are negative.  Physical Exam: Well-developed well-nourished in no acute distress.  Skin is warm and dry.  HEENT is normal.  Neck is supple.  Chest is clear to auscultation with normal expansion.  Cardiovascular exam is regular rate and rhythm.  Abdominal exam  nontender or distended. No masses palpated. Extremities show no edema. neuro grossly intact  A/P  1 syncope-no further episodes.  Question if previous episode was vagally mediated.  Previous monitor unremarkable.  Echocardiogram showed normal LV function.  Patient may resume driving 6 months following previous episode.  2 hypertension-blood pressure is elevated.  Increase lisinopril to 40 mg daily and continue present dose of HCTZ.  Check potassium and renal function in 1 week.  Follow blood pressure at home.  Olga MillersBrian Aryani Daffern, MD

## 2017-07-20 ENCOUNTER — Ambulatory Visit: Payer: BLUE CROSS/BLUE SHIELD | Admitting: Cardiology

## 2017-07-20 ENCOUNTER — Encounter: Payer: Self-pay | Admitting: Cardiology

## 2017-07-20 VITALS — BP 136/88 | HR 107 | Ht 63.0 in | Wt 182.0 lb

## 2017-07-20 DIAGNOSIS — I10 Essential (primary) hypertension: Secondary | ICD-10-CM

## 2017-07-20 DIAGNOSIS — R55 Syncope and collapse: Secondary | ICD-10-CM

## 2017-07-20 MED ORDER — HYDROCHLOROTHIAZIDE 12.5 MG PO CAPS: 12.5000 mg | ORAL_CAPSULE | Freq: Every day | ORAL | 3 refills | Status: DC

## 2017-07-20 MED ORDER — LISINOPRIL 40 MG PO TABS: 40.0000 mg | ORAL_TABLET | Freq: Every day | ORAL | 3 refills | Status: DC

## 2017-07-20 NOTE — Patient Instructions (Signed)
Medication Instructions:   STOP LISINOPRIL/HCTZ  START LISINOPRIL 40 MG ONCE DAILY  START HCTZ 12.5 MG ONCE DAILY  Labwork:  Your physician recommends that you return FOR LAB WORK IN ONE WEEK  Follow-Up:  Your physician wants you to follow-up in: ONE YEAR WITH DR Shelda PalRENSHAW You will receive a reminder letter in the mail two months in advance. If you don't receive a letter, please call our office to schedule the follow-up appointment.   If you need a refill on your cardiac medications before your next appointment, please call your pharmacy.

## 2017-07-28 DIAGNOSIS — I10 Essential (primary) hypertension: Secondary | ICD-10-CM | POA: Diagnosis not present

## 2017-07-29 LAB — BASIC METABOLIC PANEL
BUN/Creatinine Ratio: 12 (ref 12–28)
BUN: 14 mg/dL (ref 8–27)
CHLORIDE: 103 mmol/L (ref 96–106)
CO2: 27 mmol/L (ref 20–29)
Calcium: 9.4 mg/dL (ref 8.7–10.3)
Creatinine, Ser: 1.18 mg/dL — ABNORMAL HIGH (ref 0.57–1.00)
GFR calc Af Amer: 58 mL/min/{1.73_m2} — ABNORMAL LOW (ref >59)
GFR calc non Af Amer: 50 mL/min/{1.73_m2} — ABNORMAL LOW (ref >59)
GLUCOSE: 84 mg/dL (ref 65–99)
Potassium: 3.8 mmol/L (ref 3.5–5.2)
SODIUM: 144 mmol/L (ref 134–144)

## 2017-07-31 ENCOUNTER — Encounter: Payer: Self-pay | Admitting: *Deleted

## 2018-03-08 ENCOUNTER — Observation Stay (HOSPITAL_COMMUNITY)
Admission: EM | Admit: 2018-03-08 | Discharge: 2018-03-09 | Disposition: A | Discharge status: Home / Self Care | Payer: BLUE CROSS/BLUE SHIELD | Attending: Family Medicine | Admitting: Family Medicine

## 2018-03-08 DIAGNOSIS — I129 Hypertensive chronic kidney disease with stage 1 through stage 4 chronic kidney disease, or unspecified chronic kidney disease: Secondary | ICD-10-CM | POA: Diagnosis not present

## 2018-03-08 DIAGNOSIS — E876 Hypokalemia: Secondary | ICD-10-CM | POA: Insufficient documentation

## 2018-03-08 DIAGNOSIS — T68XXXA Hypothermia, initial encounter: Secondary | ICD-10-CM | POA: Diagnosis present

## 2018-03-08 DIAGNOSIS — N183 Chronic kidney disease, stage 3 unspecified: Secondary | ICD-10-CM | POA: Diagnosis present

## 2018-03-08 DIAGNOSIS — T699XXA Effect of reduced temperature, unspecified, initial encounter: Secondary | ICD-10-CM | POA: Diagnosis present

## 2018-03-08 DIAGNOSIS — Z79899 Other long term (current) drug therapy: Secondary | ICD-10-CM | POA: Diagnosis not present

## 2018-03-08 DIAGNOSIS — Z791 Long term (current) use of non-steroidal anti-inflammatories (NSAID): Secondary | ICD-10-CM | POA: Diagnosis not present

## 2018-03-08 DIAGNOSIS — R5383 Other fatigue: Secondary | ICD-10-CM | POA: Diagnosis not present

## 2018-03-08 DIAGNOSIS — R7989 Other specified abnormal findings of blood chemistry: Secondary | ICD-10-CM | POA: Diagnosis not present

## 2018-03-08 DIAGNOSIS — G9341 Metabolic encephalopathy: Secondary | ICD-10-CM | POA: Diagnosis not present

## 2018-03-08 DIAGNOSIS — R000 Tachycardia, unspecified: Secondary | ICD-10-CM | POA: Diagnosis not present

## 2018-03-08 DIAGNOSIS — S0990XA Unspecified injury of head, initial encounter: Secondary | ICD-10-CM | POA: Diagnosis not present

## 2018-03-08 DIAGNOSIS — R410 Disorientation, unspecified: Secondary | ICD-10-CM | POA: Diagnosis present

## 2018-03-08 DIAGNOSIS — R778 Other specified abnormalities of plasma proteins: Secondary | ICD-10-CM

## 2018-03-08 DIAGNOSIS — R680 Hypothermia, not associated with low environmental temperature: Secondary | ICD-10-CM | POA: Diagnosis not present

## 2018-03-08 DIAGNOSIS — R945 Abnormal results of liver function studies: Secondary | ICD-10-CM | POA: Diagnosis not present

## 2018-03-08 DIAGNOSIS — X31XXXA Exposure to excessive natural cold, initial encounter: Secondary | ICD-10-CM | POA: Diagnosis not present

## 2018-03-08 DIAGNOSIS — E039 Hypothyroidism, unspecified: Principal | ICD-10-CM | POA: Insufficient documentation

## 2018-03-08 DIAGNOSIS — R0902 Hypoxemia: Secondary | ICD-10-CM | POA: Diagnosis not present

## 2018-03-08 DIAGNOSIS — I10 Essential (primary) hypertension: Secondary | ICD-10-CM | POA: Diagnosis not present

## 2018-03-09 ENCOUNTER — Other Ambulatory Visit: Payer: Self-pay

## 2018-03-09 ENCOUNTER — Emergency Department (HOSPITAL_COMMUNITY): Payer: BLUE CROSS/BLUE SHIELD

## 2018-03-09 ENCOUNTER — Encounter (HOSPITAL_COMMUNITY): Payer: Self-pay

## 2018-03-09 DIAGNOSIS — R945 Abnormal results of liver function studies: Secondary | ICD-10-CM

## 2018-03-09 DIAGNOSIS — T68XXXA Hypothermia, initial encounter: Secondary | ICD-10-CM | POA: Diagnosis not present

## 2018-03-09 DIAGNOSIS — R680 Hypothermia, not associated with low environmental temperature: Secondary | ICD-10-CM | POA: Diagnosis not present

## 2018-03-09 DIAGNOSIS — N183 Chronic kidney disease, stage 3 unspecified: Secondary | ICD-10-CM | POA: Diagnosis present

## 2018-03-09 DIAGNOSIS — G9341 Metabolic encephalopathy: Secondary | ICD-10-CM

## 2018-03-09 DIAGNOSIS — R778 Other specified abnormalities of plasma proteins: Secondary | ICD-10-CM | POA: Diagnosis present

## 2018-03-09 DIAGNOSIS — S0990XA Unspecified injury of head, initial encounter: Secondary | ICD-10-CM | POA: Diagnosis not present

## 2018-03-09 DIAGNOSIS — R7989 Other specified abnormal findings of blood chemistry: Secondary | ICD-10-CM

## 2018-03-09 DIAGNOSIS — T699XXA Effect of reduced temperature, unspecified, initial encounter: Secondary | ICD-10-CM

## 2018-03-09 LAB — MAGNESIUM: Magnesium: 2.1 mg/dL (ref 1.7–2.4)

## 2018-03-09 LAB — LIPID PANEL
Cholesterol: 199 mg/dL (ref 0–200)
HDL: 61 mg/dL (ref >40)
LDL Cholesterol: 132 mg/dL — ABNORMAL HIGH (ref 0–99)
Total CHOL/HDL Ratio: 3.3 RATIO
Triglycerides: 31 mg/dL (ref <150)
VLDL: 6 mg/dL (ref 0–40)

## 2018-03-09 LAB — CBC
HEMATOCRIT: 37.9 % (ref 36.0–46.0)
HEMOGLOBIN: 12.3 g/dL (ref 12.0–15.0)
MCH: 29.9 pg (ref 26.0–34.0)
MCHC: 32.5 g/dL (ref 30.0–36.0)
MCV: 92.2 fL (ref 80.0–100.0)
Platelets: 212 10*3/uL (ref 150–400)
RBC: 4.11 MIL/uL (ref 3.87–5.11)
RDW: 13 % (ref 11.5–15.5)
WBC: 8.9 10*3/uL (ref 4.0–10.5)
nRBC: 0 % (ref 0.0–0.2)

## 2018-03-09 LAB — BASIC METABOLIC PANEL
Anion gap: 11 (ref 5–15)
BUN: 15 mg/dL (ref 8–23)
CHLORIDE: 106 mmol/L (ref 98–111)
CO2: 22 mmol/L (ref 22–32)
Calcium: 8.6 mg/dL — ABNORMAL LOW (ref 8.9–10.3)
Creatinine, Ser: 0.97 mg/dL (ref 0.44–1.00)
GFR calc Af Amer: >60 mL/min (ref >60)
GFR calc non Af Amer: >60 mL/min (ref >60)
Glucose, Bld: 88 mg/dL (ref 70–99)
Potassium: 3.2 mmol/L — ABNORMAL LOW (ref 3.5–5.1)
Sodium: 139 mmol/L (ref 135–145)

## 2018-03-09 LAB — CBC WITH DIFFERENTIAL/PLATELET
Abs Immature Granulocytes: 0.02 10*3/uL (ref 0.00–0.07)
BASOS PCT: 0 %
Basophils Absolute: 0 10*3/uL (ref 0.0–0.1)
EOS ABS: 0.1 10*3/uL (ref 0.0–0.5)
Eosinophils Relative: 1 %
HCT: 40.9 % (ref 36.0–46.0)
Hemoglobin: 12.7 g/dL (ref 12.0–15.0)
Immature Granulocytes: 0 %
Lymphocytes Relative: 14 %
Lymphs Abs: 1.2 10*3/uL (ref 0.7–4.0)
MCH: 29.6 pg (ref 26.0–34.0)
MCHC: 31.1 g/dL (ref 30.0–36.0)
MCV: 95.3 fL (ref 80.0–100.0)
MONO ABS: 0.4 10*3/uL (ref 0.1–1.0)
MONOS PCT: 5 %
Neutro Abs: 6.7 10*3/uL (ref 1.7–7.7)
Neutrophils Relative %: 80 %
PLATELETS: 217 10*3/uL (ref 150–400)
RBC: 4.29 MIL/uL (ref 3.87–5.11)
RDW: 13.1 % (ref 11.5–15.5)
WBC: 8.4 10*3/uL (ref 4.0–10.5)
nRBC: 0 % (ref 0.0–0.2)

## 2018-03-09 LAB — COMPREHENSIVE METABOLIC PANEL
ALT: 69 U/L — AB (ref 0–44)
ANION GAP: 13 (ref 5–15)
AST: 68 U/L — ABNORMAL HIGH (ref 15–41)
Albumin: 3.8 g/dL (ref 3.5–5.0)
Alkaline Phosphatase: 49 U/L (ref 38–126)
BUN: 14 mg/dL (ref 8–23)
CHLORIDE: 105 mmol/L (ref 98–111)
CO2: 23 mmol/L (ref 22–32)
CREATININE: 1.16 mg/dL — AB (ref 0.44–1.00)
Calcium: 8.9 mg/dL (ref 8.9–10.3)
GFR calc non Af Amer: 50 mL/min — ABNORMAL LOW (ref >60)
GFR, EST AFRICAN AMERICAN: 58 mL/min — AB (ref >60)
Glucose, Bld: 128 mg/dL — ABNORMAL HIGH (ref 70–99)
POTASSIUM: 3.8 mmol/L (ref 3.5–5.1)
Sodium: 141 mmol/L (ref 135–145)
Total Bilirubin: 0.7 mg/dL (ref 0.3–1.2)
Total Protein: 7.1 g/dL (ref 6.5–8.1)

## 2018-03-09 LAB — CK: Total CK: 326 U/L — ABNORMAL HIGH (ref 38–234)

## 2018-03-09 LAB — TROPONIN I
TROPONIN I: 0.03 ng/mL — AB (ref <0.03)
Troponin I: 0.08 ng/mL (ref <0.03)
Troponin I: 0.05 ng/mL (ref <0.03)

## 2018-03-09 LAB — BRAIN NATRIURETIC PEPTIDE: B Natriuretic Peptide: 52.9 pg/mL (ref 0.0–100.0)

## 2018-03-09 LAB — HEMOGLOBIN A1C
Hgb A1c MFr Bld: 6.4 % — ABNORMAL HIGH (ref 4.8–5.6)
Mean Plasma Glucose: 136.98 mg/dL

## 2018-03-09 LAB — MRSA PCR SCREENING: MRSA by PCR: NEGATIVE

## 2018-03-09 LAB — HIV ANTIBODY (ROUTINE TESTING W REFLEX): HIV Screen 4th Generation wRfx: NONREACTIVE

## 2018-03-09 MED ORDER — CARBAMIDE PEROXIDE 6.5 % OT SOLN
5.0000 [drp] | Freq: Two times a day (BID) | OTIC | Status: DC
  Administered 2018-03-09: 5 [drp] via OTIC
  Filled 2018-03-09 (×2): qty 15

## 2018-03-09 MED ORDER — ENOXAPARIN SODIUM 40 MG/0.4ML ~~LOC~~ SOLN: 40.0000 mg | SUBCUTANEOUS | Status: DC

## 2018-03-09 MED ORDER — ONDANSETRON HCL 4 MG PO TABS: 4.0000 mg | ORAL_TABLET | Freq: Four times a day (QID) | ORAL | Status: DC | PRN

## 2018-03-09 MED ORDER — ACETAMINOPHEN 500 MG PO TABS
1000.0000 mg | ORAL_TABLET | Freq: Once | ORAL | Status: AC
  Administered 2018-03-09: 1000 mg via ORAL
  Filled 2018-03-09: qty 2

## 2018-03-09 MED ORDER — KETOROLAC TROMETHAMINE 15 MG/ML IJ SOLN
15.0000 mg | Freq: Once | INTRAMUSCULAR | Status: DC
  Filled 2018-03-09: qty 1

## 2018-03-09 MED ORDER — POTASSIUM CHLORIDE CRYS ER 20 MEQ PO TBCR
40.0000 meq | EXTENDED_RELEASE_TABLET | Freq: Once | ORAL | Status: AC
  Administered 2018-03-09: 40 meq via ORAL
  Filled 2018-03-09: qty 2

## 2018-03-09 MED ORDER — KETOROLAC TROMETHAMINE 15 MG/ML IJ SOLN
15.0000 mg | Freq: Once | INTRAMUSCULAR | Status: AC
  Administered 2018-03-09: 15 mg via INTRAVENOUS

## 2018-03-09 MED ORDER — HYDRALAZINE HCL 20 MG/ML IJ SOLN: 5.0000 mg | INTRAMUSCULAR | Status: DC | PRN

## 2018-03-09 MED ORDER — SODIUM CHLORIDE 0.9 % IV SOLN
Freq: Once | INTRAVENOUS | Status: AC
  Administered 2018-03-09: via INTRAVENOUS

## 2018-03-09 MED ORDER — ONDANSETRON HCL 4 MG/2ML IJ SOLN: 4.0000 mg | Freq: Four times a day (QID) | INTRAMUSCULAR | Status: DC | PRN

## 2018-03-09 NOTE — Plan of Care (Signed)

## 2018-03-09 NOTE — ED Notes (Signed)
Bair hugger removed, placed under warm blankets
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
## 2018-03-09 NOTE — Discharge Instructions (Signed)
Kristin Barton,  You were in the hospital because of low body temperature (hypothermia). You were warmed up and have sustained a good temperature today. Please resume your home medications. On review, your hydrochlorothiazide (HCTZ) is what causes your low potassium. Per your last cardiologist's note, you are to be taking lisinopril 40 mg, hydrochlorothiazide 12.5 mg and potassium.
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
## 2018-03-10 LAB — HEPATITIS PANEL, ACUTE
HCV Ab: <0.1 s/co ratio (ref 0.0–0.9)
Hep A IgM: NEGATIVE
Hep B C IgM: NEGATIVE
Hepatitis B Surface Ag: NEGATIVE

## 2018-03-13 ENCOUNTER — Ambulatory Visit (INDEPENDENT_AMBULATORY_CARE_PROVIDER_SITE_OTHER): Payer: BLUE CROSS/BLUE SHIELD | Admitting: Family Medicine

## 2018-03-13 ENCOUNTER — Encounter: Payer: Self-pay | Admitting: Family Medicine

## 2018-03-13 VITALS — BP 138/94 | HR 76 | Temp 98.5°F | Ht 63.0 in

## 2018-03-13 DIAGNOSIS — N183 Chronic kidney disease, stage 3 unspecified: Secondary | ICD-10-CM

## 2018-03-13 DIAGNOSIS — T68XXXD Hypothermia, subsequent encounter: Secondary | ICD-10-CM

## 2018-03-13 DIAGNOSIS — F430 Acute stress reaction: Secondary | ICD-10-CM | POA: Insufficient documentation

## 2018-03-13 DIAGNOSIS — R945 Abnormal results of liver function studies: Secondary | ICD-10-CM | POA: Diagnosis not present

## 2018-03-13 DIAGNOSIS — G9341 Metabolic encephalopathy: Secondary | ICD-10-CM

## 2018-03-13 DIAGNOSIS — J019 Acute sinusitis, unspecified: Secondary | ICD-10-CM | POA: Insufficient documentation

## 2018-03-13 DIAGNOSIS — I10 Essential (primary) hypertension: Secondary | ICD-10-CM

## 2018-03-13 DIAGNOSIS — E876 Hypokalemia: Secondary | ICD-10-CM | POA: Diagnosis not present

## 2018-03-13 DIAGNOSIS — R51 Headache: Secondary | ICD-10-CM

## 2018-03-13 DIAGNOSIS — R519 Headache, unspecified: Secondary | ICD-10-CM | POA: Insufficient documentation

## 2018-03-13 DIAGNOSIS — M791 Myalgia, unspecified site: Secondary | ICD-10-CM

## 2018-03-13 DIAGNOSIS — T699XXA Effect of reduced temperature, unspecified, initial encounter: Secondary | ICD-10-CM

## 2018-03-13 DIAGNOSIS — R7989 Other specified abnormal findings of blood chemistry: Secondary | ICD-10-CM

## 2018-03-13 DIAGNOSIS — J0110 Acute frontal sinusitis, unspecified: Secondary | ICD-10-CM

## 2018-03-13 DIAGNOSIS — G44319 Acute post-traumatic headache, not intractable: Secondary | ICD-10-CM

## 2018-03-13 MED ORDER — METHOCARBAMOL 500 MG PO TABS: 500.0000 mg | ORAL_TABLET | Freq: Three times a day (TID) | ORAL | 1 refills | Status: DC | PRN

## 2018-03-13 MED ORDER — AMOXICILLIN-POT CLAVULANATE 875-125 MG PO TABS: 1.0000 | ORAL_TABLET | Freq: Two times a day (BID) | ORAL | 0 refills | Status: DC
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
## 2018-03-13 NOTE — Patient Instructions (Signed)
You can try flonase nasal spray over the counter for congestion Take augmentin for for a suspected sinus infection   Labs today for kidney and liver function   Let's take you out of work for 2 week  Follow up with me in a week
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
## 2018-03-13 NOTE — Assessment & Plan Note (Signed)
Taking K dur 10 meq daily  Was low in the hospital and supplemented Lab today to re check  Reviewed hospital records, lab results and studies in detail
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
## 2018-03-13 NOTE — Assessment & Plan Note (Signed)
Unsure of cause-may be due to her rxn to hypothermia Reviewed hospital records, lab results and studies in detail   Clinically improved  Re check LFT today  Consider further eval if needed Hepatitis abx were neg
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
## 2018-03-13 NOTE — Assessment & Plan Note (Signed)
Working to control HTN  Noted low K also in hospital Re check today renal panel Enc to keep up a good water intake
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
## 2018-03-14 ENCOUNTER — Telehealth: Payer: Self-pay | Admitting: Family Medicine

## 2018-03-14 ENCOUNTER — Telehealth: Payer: Self-pay

## 2018-03-14 ENCOUNTER — Ambulatory Visit (INDEPENDENT_AMBULATORY_CARE_PROVIDER_SITE_OTHER): Payer: BLUE CROSS/BLUE SHIELD | Admitting: Family Medicine

## 2018-03-14 ENCOUNTER — Encounter: Payer: Self-pay | Admitting: Family Medicine

## 2018-03-14 VITALS — BP 144/78 | HR 83 | Temp 97.9°F | Ht 63.0 in | Wt 188.6 lb

## 2018-03-14 DIAGNOSIS — M545 Low back pain, unspecified: Secondary | ICD-10-CM

## 2018-03-14 DIAGNOSIS — M791 Myalgia, unspecified site: Secondary | ICD-10-CM | POA: Diagnosis not present

## 2018-03-14 DIAGNOSIS — G44319 Acute post-traumatic headache, not intractable: Secondary | ICD-10-CM

## 2018-03-14 DIAGNOSIS — M542 Cervicalgia: Secondary | ICD-10-CM

## 2018-03-14 DIAGNOSIS — F430 Acute stress reaction: Secondary | ICD-10-CM

## 2018-03-14 LAB — HEPATIC FUNCTION PANEL
ALT: 35 U/L (ref 0–35)
AST: 30 U/L (ref 0–37)
Albumin: 4.3 g/dL (ref 3.5–5.2)
Alkaline Phosphatase: 51 U/L (ref 39–117)
BILIRUBIN TOTAL: 0.4 mg/dL (ref 0.2–1.2)
Bilirubin, Direct: 0.1 mg/dL (ref 0.0–0.3)
Total Protein: 7.5 g/dL (ref 6.0–8.3)

## 2018-03-14 LAB — RENAL FUNCTION PANEL
Albumin: 4.3 g/dL (ref 3.5–5.2)
BUN: 13 mg/dL (ref 6–23)
CO2: 29 mEq/L (ref 19–32)
Calcium: 9.5 mg/dL (ref 8.4–10.5)
Chloride: 99 mEq/L (ref 96–112)
Creatinine, Ser: 1.27 mg/dL — ABNORMAL HIGH (ref 0.40–1.20)
GFR: 51.51 mL/min — ABNORMAL LOW (ref >60.00)
Glucose, Bld: 86 mg/dL (ref 70–99)
PHOSPHORUS: 4.1 mg/dL (ref 2.3–4.6)
Potassium: 3.6 mEq/L (ref 3.5–5.1)
Sodium: 137 mEq/L (ref 135–145)

## 2018-03-14 NOTE — Telephone Encounter (Signed)
Discuss directly with Dr. Milinda Antis, no one is with pt right now so can't eval, Glenda pt's sister wants to keep 4:15 appt so she can come with her to explain her concerns with pt, it's not her HA they are concerned with it's her thought process and speech.

## 2018-03-14 NOTE — Telephone Encounter (Signed)
Pt dropped off FMLA paperwork.  She stated she would like to pick these up Friday morning. She needs to return paperwork they do not want paperwork faxed  Paperwork in dr tower's in box

## 2018-03-14 NOTE — Assessment & Plan Note (Signed)
Improved from yesterday with methocarbamol/hydraion and rest Nl neuro exam today and MS is normal  Will watch this closely  Neck pain /tightness may be the source and PT was ordered Will watch for any s/s of concussion -family aware

## 2018-03-14 NOTE — Telephone Encounter (Signed)
Please ask one of her family members if she is confused or acting differently from yesterday  How is her headache today  I may need to get another CT scan or have her go to ED if her mental status has changed (that can be an emergency)  Since her appt is the last of the day I just don't want to wait if her situation is bad   Yesterday she was anxious  Family was with her  She tends to ramble a bit baseline but did not seem to be confused
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
## 2018-03-14 NOTE — Telephone Encounter (Signed)
Some concern over PTSD as well (declined counseling yesterday but will bring it up again) I will see her then

## 2018-03-14 NOTE — Telephone Encounter (Signed)
error 
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
## 2018-03-15 NOTE — Telephone Encounter (Signed)
Copy for pt Copy for scan Pt stated do not fax paperwork  She will come by to pick up No phone number to call to let her know it is ready

## 2018-03-19 ENCOUNTER — Ambulatory Visit: Payer: BLUE CROSS/BLUE SHIELD | Admitting: Family Medicine

## 2018-03-21 DIAGNOSIS — M62838 Other muscle spasm: Secondary | ICD-10-CM | POA: Diagnosis not present

## 2018-03-21 DIAGNOSIS — M2560 Stiffness of unspecified joint, not elsewhere classified: Secondary | ICD-10-CM | POA: Diagnosis not present

## 2018-03-21 DIAGNOSIS — M542 Cervicalgia: Secondary | ICD-10-CM | POA: Diagnosis not present

## 2018-03-21 DIAGNOSIS — M545 Low back pain: Secondary | ICD-10-CM | POA: Diagnosis not present

## 2018-03-22 NOTE — Telephone Encounter (Signed)
Office note in blue folder with paperwork

## 2018-03-22 NOTE — Telephone Encounter (Signed)
Pt states she seen the physical therapist yesterday. He will need her for 6 weeks, twice a week. The pt will need the dates extended. Best cb [phone]

## 2018-03-22 NOTE — Telephone Encounter (Signed)
Thanks -I need to see the PT note before I can sign-please call for it

## 2018-03-22 NOTE — Telephone Encounter (Signed)
Pt called back stating she spoke with [facility] and was told we can fax the ppw to Maudry Diego at [phone].

## 2018-03-22 NOTE — Telephone Encounter (Signed)
Paperwork faxed Copy for pt Copy for scan  Pt aware 

## 2018-03-22 NOTE — Telephone Encounter (Signed)
Thanks  Done and in IN box 

## 2018-03-22 NOTE — Telephone Encounter (Signed)
Spoke with pt she stated she saw PT yesterday and they wanted her to be out of work 6 more weeks from yesterday 2/19.  She will be going to PT twice a week  Paperwork in dr tower in box Please initial and date areas that have been changed

## 2018-03-26 ENCOUNTER — Telehealth: Payer: Self-pay | Admitting: Family Medicine

## 2018-03-26 NOTE — Telephone Encounter (Signed)
Pt is wondering if she will need to be scheduled for an office visit after she finishes physical therapy in order to be released back to work.Please advise.

## 2018-03-26 NOTE — Telephone Encounter (Signed)
[employer] Phone [phone] Fax # [phone]   Malachi Bonds called needing a letter stating ms Kolodziejczyk was seen the office and cannot returned to work until 05/02/2018 or after next eval.  She will have to be seen before coming back to work with no restrictions.

## 2018-03-26 NOTE — Telephone Encounter (Signed)
Letter done and in IN box 

## 2018-03-27 DIAGNOSIS — M62838 Other muscle spasm: Secondary | ICD-10-CM | POA: Diagnosis not present

## 2018-03-27 DIAGNOSIS — M542 Cervicalgia: Secondary | ICD-10-CM | POA: Diagnosis not present

## 2018-03-27 DIAGNOSIS — M545 Low back pain: Secondary | ICD-10-CM | POA: Diagnosis not present

## 2018-03-27 DIAGNOSIS — M2560 Stiffness of unspecified joint, not elsewhere classified: Secondary | ICD-10-CM | POA: Diagnosis not present

## 2018-03-27 NOTE — Telephone Encounter (Signed)
Letter faxed.

## 2018-03-29 DIAGNOSIS — M542 Cervicalgia: Secondary | ICD-10-CM | POA: Diagnosis not present

## 2018-03-29 DIAGNOSIS — M545 Low back pain: Secondary | ICD-10-CM | POA: Diagnosis not present

## 2018-03-29 DIAGNOSIS — M62838 Other muscle spasm: Secondary | ICD-10-CM | POA: Diagnosis not present

## 2018-03-29 DIAGNOSIS — M2560 Stiffness of unspecified joint, not elsewhere classified: Secondary | ICD-10-CM | POA: Diagnosis not present

## 2018-04-03 DIAGNOSIS — R51 Headache: Secondary | ICD-10-CM | POA: Diagnosis not present

## 2018-04-03 DIAGNOSIS — M2560 Stiffness of unspecified joint, not elsewhere classified: Secondary | ICD-10-CM | POA: Diagnosis not present

## 2018-04-03 DIAGNOSIS — M545 Low back pain: Secondary | ICD-10-CM | POA: Diagnosis not present

## 2018-04-03 DIAGNOSIS — M542 Cervicalgia: Secondary | ICD-10-CM | POA: Diagnosis not present

## 2018-04-05 DIAGNOSIS — M545 Low back pain: Secondary | ICD-10-CM | POA: Diagnosis not present

## 2018-04-05 DIAGNOSIS — M542 Cervicalgia: Secondary | ICD-10-CM | POA: Diagnosis not present

## 2018-04-05 DIAGNOSIS — M2560 Stiffness of unspecified joint, not elsewhere classified: Secondary | ICD-10-CM | POA: Diagnosis not present

## 2018-04-05 DIAGNOSIS — M62838 Other muscle spasm: Secondary | ICD-10-CM | POA: Diagnosis not present

## 2018-04-06 ENCOUNTER — Other Ambulatory Visit: Payer: Self-pay | Admitting: Family Medicine

## 2018-04-06 DIAGNOSIS — Z1231 Encounter for screening mammogram for malignant neoplasm of breast: Secondary | ICD-10-CM

## 2018-04-10 DIAGNOSIS — M545 Low back pain: Secondary | ICD-10-CM | POA: Diagnosis not present

## 2018-04-10 DIAGNOSIS — M2560 Stiffness of unspecified joint, not elsewhere classified: Secondary | ICD-10-CM | POA: Diagnosis not present

## 2018-04-10 DIAGNOSIS — M62838 Other muscle spasm: Secondary | ICD-10-CM | POA: Diagnosis not present

## 2018-04-10 DIAGNOSIS — M542 Cervicalgia: Secondary | ICD-10-CM | POA: Diagnosis not present

## 2018-04-12 DIAGNOSIS — M542 Cervicalgia: Secondary | ICD-10-CM | POA: Diagnosis not present

## 2018-04-12 DIAGNOSIS — M62838 Other muscle spasm: Secondary | ICD-10-CM | POA: Diagnosis not present

## 2018-04-12 DIAGNOSIS — M545 Low back pain: Secondary | ICD-10-CM | POA: Diagnosis not present

## 2018-04-12 DIAGNOSIS — M2560 Stiffness of unspecified joint, not elsewhere classified: Secondary | ICD-10-CM | POA: Diagnosis not present

## 2018-04-16 ENCOUNTER — Ambulatory Visit
Admission: RE | Admit: 2018-04-16 | Discharge: 2018-04-16 | Disposition: A | Discharge status: Home / Self Care | Payer: BLUE CROSS/BLUE SHIELD | Source: Ambulatory Visit | Attending: Family Medicine | Admitting: Family Medicine

## 2018-04-16 ENCOUNTER — Other Ambulatory Visit: Payer: Self-pay

## 2018-04-16 ENCOUNTER — Other Ambulatory Visit: Payer: Self-pay | Admitting: Family Medicine

## 2018-04-16 DIAGNOSIS — Z1231 Encounter for screening mammogram for malignant neoplasm of breast: Secondary | ICD-10-CM | POA: Diagnosis not present

## 2018-04-23 ENCOUNTER — Ambulatory Visit (INDEPENDENT_AMBULATORY_CARE_PROVIDER_SITE_OTHER): Payer: BLUE CROSS/BLUE SHIELD | Admitting: Psychology

## 2018-04-23 ENCOUNTER — Other Ambulatory Visit: Payer: Self-pay

## 2018-04-23 DIAGNOSIS — F4322 Adjustment disorder with anxiety: Secondary | ICD-10-CM

## 2018-04-24 DIAGNOSIS — R51 Headache: Secondary | ICD-10-CM | POA: Diagnosis not present

## 2018-04-24 DIAGNOSIS — M542 Cervicalgia: Secondary | ICD-10-CM | POA: Diagnosis not present

## 2018-04-24 DIAGNOSIS — M2560 Stiffness of unspecified joint, not elsewhere classified: Secondary | ICD-10-CM | POA: Diagnosis not present

## 2018-04-24 DIAGNOSIS — M545 Low back pain: Secondary | ICD-10-CM | POA: Diagnosis not present

## 2018-04-25 ENCOUNTER — Telehealth: Payer: Self-pay | Admitting: Family Medicine

## 2018-04-25 NOTE — Telephone Encounter (Signed)
Left VM requesting pt to call the office back 

## 2018-04-25 NOTE — Telephone Encounter (Signed)
Pt called stated she needs her fmla extended another 4 weeks. She stated she called Maudry Diego her HR person.  She needs a letter stating FMLA has been extended.   She went to PT @ [facility] 3/24.  They have not told her when she could go back to work.  I called [facility] they faxed notes yesterday but will fax again today to make sure you have them.   copy of Paperwork in dr tower's in box if you need to reference for letter

## 2018-04-25 NOTE — Telephone Encounter (Signed)
How is she feeling?

## 2018-04-25 NOTE — Telephone Encounter (Signed)
The FMLA paperwork says return 05/02/18 , is that correct ?  Does she need a letter also? - just want to re affirm that is the date Thanks

## 2018-04-26 DIAGNOSIS — M2560 Stiffness of unspecified joint, not elsewhere classified: Secondary | ICD-10-CM | POA: Diagnosis not present

## 2018-04-26 DIAGNOSIS — M62838 Other muscle spasm: Secondary | ICD-10-CM | POA: Diagnosis not present

## 2018-04-26 DIAGNOSIS — M542 Cervicalgia: Secondary | ICD-10-CM | POA: Diagnosis not present

## 2018-04-26 DIAGNOSIS — M545 Low back pain: Secondary | ICD-10-CM | POA: Diagnosis not present

## 2018-04-26 NOTE — Telephone Encounter (Signed)
Ok- does she need a letter stating that we don't know when she can return to work? Or new FMLA paperwork? (I have the old on my desk)  If FMLA we HAVE to specify a return to work date (will not be accepted otherwise)

## 2018-04-26 NOTE — Telephone Encounter (Signed)
Left message with family requesting pt to call the office back, family said she was at her PT appt when I called

## 2018-04-26 NOTE — Telephone Encounter (Signed)
Left 2nd VM requesting pt to call the office  

## 2018-04-26 NOTE — Telephone Encounter (Signed)
She was suppose to return on 05/02/18, but she has a PT appt on 05/03/18, pt said she doesn't know when she can return to work because PT hasn't told her anything so she can't give [place] a return date but knows it wont be 05/02/18, pt said she is feeling a lot better but she still has issues with her back and legs so that's what PT is still working on

## 2018-04-27 NOTE — Telephone Encounter (Addendum)
Letter faxed to the [facility] fax # since pt didn't have a direct fax #, and pt notified

## 2018-04-27 NOTE — Telephone Encounter (Signed)
Left VM requesting pt to call the office back 

## 2018-04-27 NOTE — Telephone Encounter (Signed)
Done and in IN box 

## 2018-04-27 NOTE — Telephone Encounter (Signed)
Pt said she just needs a letter faxed to her case worker for [employer], pt said she just needs a letter saying that her leave needs to be extended due to her still having PT, she has PT 2x a week but the PT can't tell her when she will be done with them but until her PT is done they can't clear her to go back to work

## 2018-05-01 DIAGNOSIS — M62838 Other muscle spasm: Secondary | ICD-10-CM | POA: Diagnosis not present

## 2018-05-01 DIAGNOSIS — M542 Cervicalgia: Secondary | ICD-10-CM | POA: Diagnosis not present

## 2018-05-01 DIAGNOSIS — M545 Low back pain: Secondary | ICD-10-CM | POA: Diagnosis not present

## 2018-05-01 DIAGNOSIS — M2560 Stiffness of unspecified joint, not elsewhere classified: Secondary | ICD-10-CM | POA: Diagnosis not present

## 2018-05-03 DIAGNOSIS — M62838 Other muscle spasm: Secondary | ICD-10-CM | POA: Diagnosis not present

## 2018-05-03 DIAGNOSIS — M2560 Stiffness of unspecified joint, not elsewhere classified: Secondary | ICD-10-CM | POA: Diagnosis not present

## 2018-05-03 DIAGNOSIS — M542 Cervicalgia: Secondary | ICD-10-CM | POA: Diagnosis not present

## 2018-05-03 DIAGNOSIS — M545 Low back pain: Secondary | ICD-10-CM | POA: Diagnosis not present

## 2018-05-04 ENCOUNTER — Telehealth: Payer: Self-pay | Admitting: Family Medicine

## 2018-05-04 NOTE — Telephone Encounter (Signed)
Pt is having PT on her neck and back and was told to contact her PCP to see if she need to continue. PT stated they have done all they can for the pt. Pt stated she can't go back to PT until she see her doctor and she is still having pain. Advised pt that Dr Milinda Antis was out of office today she would hear back next week and she was ok with that.

## 2018-05-06 NOTE — Telephone Encounter (Signed)
Please have her come in for a visit (not virtual) for neck and back pain  If she wants to wait until the covid scare settles down this is understandable

## 2018-05-07 NOTE — Telephone Encounter (Signed)
Per DPR, left detail message of Dr Royden Purl comments for patient to call back.

## 2018-05-07 NOTE — Telephone Encounter (Signed)
Patient returned Chan's call and scheduled appointment on 05/08/18.

## 2018-05-08 ENCOUNTER — Ambulatory Visit (INDEPENDENT_AMBULATORY_CARE_PROVIDER_SITE_OTHER)
Admission: RE | Admit: 2018-05-08 | Discharge: 2018-05-08 | Disposition: A | Discharge status: Home / Self Care | Payer: BLUE CROSS/BLUE SHIELD | Source: Ambulatory Visit | Attending: Family Medicine | Admitting: Family Medicine

## 2018-05-08 ENCOUNTER — Other Ambulatory Visit: Payer: Self-pay

## 2018-05-08 ENCOUNTER — Ambulatory Visit: Payer: BLUE CROSS/BLUE SHIELD | Admitting: Family Medicine

## 2018-05-08 ENCOUNTER — Encounter: Payer: Self-pay | Admitting: Family Medicine

## 2018-05-08 VITALS — BP 136/82 | HR 74 | Temp 98.0°F | Ht 63.0 in | Wt 186.3 lb

## 2018-05-08 DIAGNOSIS — M545 Low back pain, unspecified: Secondary | ICD-10-CM

## 2018-05-08 DIAGNOSIS — M542 Cervicalgia: Secondary | ICD-10-CM | POA: Diagnosis not present

## 2018-05-08 DIAGNOSIS — F430 Acute stress reaction: Secondary | ICD-10-CM | POA: Diagnosis not present

## 2018-05-08 MED ORDER — METHOCARBAMOL 500 MG PO TABS: 500.0000 mg | ORAL_TABLET | Freq: Three times a day (TID) | ORAL | 1 refills | Status: DC | PRN

## 2018-05-08 NOTE — Assessment & Plan Note (Signed)
Much improved after counseling visit No longer thinks she needs to go  Says she is just a worrier No longer has PTSD symptoms
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
## 2018-05-08 NOTE — Patient Instructions (Addendum)
Keep using heat on your neck / stretching if you can  We will check a neck xray today and call you with a result tomorrow  Heat on your back is ok also   Tylenol is ok for pain  Robaxin (muscle relaxer) also

## 2018-05-08 NOTE — Assessment & Plan Note (Signed)
This is the bigger problem today , L sided neck pain much worse after modalities used in PT  Spasm noted  Suspect deg disc changes - xray today  No neuro symptoms  Will continue tylenol/heat/robaxin prn  May need orthopedic help

## 2018-05-08 NOTE — Assessment & Plan Note (Signed)
This has waxed and waned - has robaxin to take at night and doing PT exercises Deg disc changes seen on xray a year ago  May benefit from orthopedics in the future

## 2018-05-09 ENCOUNTER — Telehealth: Payer: Self-pay | Admitting: *Deleted

## 2018-05-09 NOTE — Telephone Encounter (Signed)
Left VM requesting pt to call office back regarding xray results 

## 2018-05-18 ENCOUNTER — Encounter: Payer: Self-pay | Admitting: *Deleted

## 2018-05-24 ENCOUNTER — Telehealth: Payer: Self-pay | Admitting: Family Medicine

## 2018-05-24 DIAGNOSIS — M545 Low back pain, unspecified: Secondary | ICD-10-CM

## 2018-05-24 DIAGNOSIS — M542 Cervicalgia: Secondary | ICD-10-CM

## 2018-05-24 NOTE — Telephone Encounter (Signed)
Best number [phone] Pt called checking on her xray results

## 2018-05-25 NOTE — Telephone Encounter (Signed)
Letter done and in [place] in box on her desk Will route also to [facility] for ortho ref

## 2018-05-25 NOTE — Telephone Encounter (Signed)
Notes recorded by Judy Pimple, MD on 05/08/2018 at 8:14 PM EDT There is disc disease at multiple levels as well as some bone spurs No doubt-adding to her pain  I want to refer her to orthopedics if agreeable (not sure how long it will take to get in with covid situaion)  Let me know  Spoken and notified patient of Dr Royden Purl comments. Patient verbalized understanding and patient is agreeable.  Also patient stated that she need a letter to extend her FMLA since she have not gone back to work.

## 2018-05-25 NOTE — Telephone Encounter (Signed)
Attached work note with xray disc, and patient has been notified.

## 2018-05-29 DIAGNOSIS — M545 Low back pain: Secondary | ICD-10-CM | POA: Diagnosis not present

## 2018-05-29 DIAGNOSIS — M542 Cervicalgia: Secondary | ICD-10-CM | POA: Diagnosis not present

## 2018-06-04 DIAGNOSIS — S39012D Strain of muscle, fascia and tendon of lower back, subsequent encounter: Secondary | ICD-10-CM | POA: Diagnosis not present

## 2018-06-04 DIAGNOSIS — M4696 Unspecified inflammatory spondylopathy, lumbar region: Secondary | ICD-10-CM | POA: Diagnosis not present

## 2018-06-04 DIAGNOSIS — S46812D Strain of other muscles, fascia and tendons at shoulder and upper arm level, left arm, subsequent encounter: Secondary | ICD-10-CM | POA: Diagnosis not present

## 2018-06-11 DIAGNOSIS — M4696 Unspecified inflammatory spondylopathy, lumbar region: Secondary | ICD-10-CM | POA: Diagnosis not present

## 2018-06-11 DIAGNOSIS — S46812D Strain of other muscles, fascia and tendons at shoulder and upper arm level, left arm, subsequent encounter: Secondary | ICD-10-CM | POA: Diagnosis not present

## 2018-06-11 DIAGNOSIS — S39012D Strain of muscle, fascia and tendon of lower back, subsequent encounter: Secondary | ICD-10-CM | POA: Diagnosis not present

## 2018-06-13 DIAGNOSIS — M4696 Unspecified inflammatory spondylopathy, lumbar region: Secondary | ICD-10-CM | POA: Diagnosis not present

## 2018-06-13 DIAGNOSIS — S39012D Strain of muscle, fascia and tendon of lower back, subsequent encounter: Secondary | ICD-10-CM | POA: Diagnosis not present

## 2018-06-13 DIAGNOSIS — S46812D Strain of other muscles, fascia and tendons at shoulder and upper arm level, left arm, subsequent encounter: Secondary | ICD-10-CM | POA: Diagnosis not present

## 2018-06-19 DIAGNOSIS — S39012D Strain of muscle, fascia and tendon of lower back, subsequent encounter: Secondary | ICD-10-CM | POA: Diagnosis not present

## 2018-06-19 DIAGNOSIS — M4696 Unspecified inflammatory spondylopathy, lumbar region: Secondary | ICD-10-CM | POA: Diagnosis not present

## 2018-06-19 DIAGNOSIS — S46812D Strain of other muscles, fascia and tendons at shoulder and upper arm level, left arm, subsequent encounter: Secondary | ICD-10-CM | POA: Diagnosis not present

## 2018-06-21 DIAGNOSIS — S46812D Strain of other muscles, fascia and tendons at shoulder and upper arm level, left arm, subsequent encounter: Secondary | ICD-10-CM | POA: Diagnosis not present

## 2018-06-21 DIAGNOSIS — S39012D Strain of muscle, fascia and tendon of lower back, subsequent encounter: Secondary | ICD-10-CM | POA: Diagnosis not present

## 2018-06-21 DIAGNOSIS — M4696 Unspecified inflammatory spondylopathy, lumbar region: Secondary | ICD-10-CM | POA: Diagnosis not present

## 2018-07-02 DIAGNOSIS — M5412 Radiculopathy, cervical region: Secondary | ICD-10-CM | POA: Diagnosis not present

## 2018-07-03 DIAGNOSIS — S46812D Strain of other muscles, fascia and tendons at shoulder and upper arm level, left arm, subsequent encounter: Secondary | ICD-10-CM | POA: Diagnosis not present

## 2018-07-03 DIAGNOSIS — S39012D Strain of muscle, fascia and tendon of lower back, subsequent encounter: Secondary | ICD-10-CM | POA: Diagnosis not present

## 2018-07-03 DIAGNOSIS — M4696 Unspecified inflammatory spondylopathy, lumbar region: Secondary | ICD-10-CM | POA: Diagnosis not present

## 2018-07-05 DIAGNOSIS — S39012D Strain of muscle, fascia and tendon of lower back, subsequent encounter: Secondary | ICD-10-CM | POA: Diagnosis not present

## 2018-07-05 DIAGNOSIS — M4696 Unspecified inflammatory spondylopathy, lumbar region: Secondary | ICD-10-CM | POA: Diagnosis not present

## 2018-07-05 DIAGNOSIS — S46812D Strain of other muscles, fascia and tendons at shoulder and upper arm level, left arm, subsequent encounter: Secondary | ICD-10-CM | POA: Diagnosis not present

## 2018-07-07 DIAGNOSIS — M542 Cervicalgia: Secondary | ICD-10-CM | POA: Diagnosis not present

## 2018-07-12 DIAGNOSIS — M5412 Radiculopathy, cervical region: Secondary | ICD-10-CM | POA: Diagnosis not present

## 2018-07-12 DIAGNOSIS — M65322 Trigger finger, left index finger: Secondary | ICD-10-CM | POA: Diagnosis not present

## 2018-07-13 ENCOUNTER — Telehealth: Payer: Self-pay | Admitting: Family Medicine

## 2018-07-13 ENCOUNTER — Other Ambulatory Visit: Payer: Self-pay | Admitting: Cardiology

## 2018-07-13 DIAGNOSIS — I10 Essential (primary) hypertension: Secondary | ICD-10-CM

## 2018-07-13 NOTE — Telephone Encounter (Signed)
We do not refill lisinopril pt's cardiologist Dr. Stanford Breed does and there is a pending refill request that was sent to him for pt's lisinopril. Called pt and advised her that Dr. Stanford Breed refills med and not [place], pt advised to f/u with them

## 2018-07-13 NOTE — Telephone Encounter (Signed)
Patient is requesting refill  LISINOPRIL   Patient has 2-3 tablets left .  Patient Called pharmacy and they stated that she is needing Authorization for this.    Kristin Barton    Patient [phone]

## 2018-07-16 DIAGNOSIS — M5412 Radiculopathy, cervical region: Secondary | ICD-10-CM | POA: Diagnosis not present

## 2018-08-02 ENCOUNTER — Telehealth: Payer: Self-pay | Admitting: Cardiology

## 2018-08-02 NOTE — Telephone Encounter (Signed)
LVM, reminding pt of her appt with Dr Crenshaw on 08-06-18. °

## 2018-08-02 NOTE — Progress Notes (Signed)
HPI: FU syncope. Seen for syncope 03/19/17; head CT negative; K 3.2; hgb 13.8.  Echocardiogram March 2019 showed normal LV function and moderate diastolic dysfunction.  Monitor March 2019 showed no significant arrhythmia.  Since last seen the patient denies any dyspnea on exertion, orthopnea, PND, pedal edema, palpitations, syncope or chest pain.   Current Outpatient Medications  Medication Sig Dispense Refill  . hydrochlorothiazide (MICROZIDE) 12.5 MG capsule Take 1 capsule (12.5 mg total) by mouth daily. 90 capsule 3  . lisinopril (ZESTRIL) 40 MG tablet Take 1 tablet (40 mg total) by mouth daily. OFFICE VISIT NEEDED 90 tablet 0  . methocarbamol (ROBAXIN) 500 MG tablet Take 1 tablet (500 mg total) by mouth 3 (three) times daily as needed for muscle spasms (muscle pain and headache). Caution of sedation 30 tablet 1  . potassium chloride (K-DUR) 10 MEQ tablet Take 1 tablet by mouth twice daily 180 tablet 1   No current facility-administered medications for this visit.      Past Medical History:  Diagnosis Date  . Abnormal urinalysis 06/18/2014  . Elevated serum creatinine 06/17/2014   With GFR of 53   . Hypertension   . Hypokalemia   . Inflamed skin tag 06/14/2013  . Need for hepatitis C screening test 01/11/2016  . Obesity 07/01/2014    Past Surgical History:  Procedure Laterality Date  . CESAREAN SECTION  1987  . PARTIAL HYSTERECTOMY     for cyst, has one ovary remaining (no hx of abn pap smears)  . TUBAL LIGATION      Social History   Socioeconomic History  . Marital status: Divorced    Spouse name: Not on file  . Number of children: 1  . Years of education: 912  . Highest education level: Not on file  Occupational History  . Occupation: [employer]    Employer: [employer]    Comment: 3rd shift  Social Needs  . Financial resource strain: Not on file  . Food insecurity    Worry: Not on file    Inability: Not on file  . Transportation needs   Medical: Not on file    Non-medical: Not on file  Tobacco Use  . Smoking status: Never Smoker  . Smokeless tobacco: Never Used  Substance and Sexual Activity  . Alcohol use: No    Alcohol/week: 0.0 standard drinks  . Drug use: No  . Sexual activity: Not on file  Lifestyle  . Physical activity    Days per week: Not on file    Minutes per session: Not on file  . Stress: Not on file  Relationships  . Social Musicianconnections    Talks on phone: Not on file    Gets together: Not on file    Attends religious service: Not on file    Active member of club or organization: Not on file    Attends meetings of clubs or organizations: Not on file    Relationship status: Not on file  . Intimate partner violence    Fear of current or ex partner: Not on file    Emotionally abused: Not on file    Physically abused: Not on file    Forced sexual activity: Not on file  Other Topics Concern  . Not on file  Social History Narrative   Regular exercise: walking   Cares for new grandchild          Family History  Problem Relation Age of Onset  .  Hypertension Mother   . Asthma Father   . COPD Father        cigars  . Lung cancer Father   . Breast cancer Sister   . Diabetes Sister        mild  . Breast cancer Sister   . Other Other        ? heart problems- distant family  . Mental illness Other        ? in family    ROS: no fevers or chills, productive cough, hemoptysis, dysphasia, odynophagia, melena, hematochezia, dysuria, hematuria, rash, seizure activity, orthopnea, PND, pedal edema, claudication. Remaining systems are negative.  Physical Exam: Well-developed well-nourished in no acute distress.  Skin is warm and dry.  HEENT is normal.  Neck is supple.  Chest is clear to auscultation with normal expansion.  Cardiovascular exam is regular rate and rhythm.  Abdominal exam nontender or distended. No masses palpated. Extremities show no edema. neuro grossly intact  ECG-sinus rhythm  at a rate of 97, nonspecific ST changes.  Personally reviewed  A/P  1 history of syncope-patient has had no further episodes.  We previously felt her episode may have been vagally mediated.  LV function normal on prior echocardiogram and negative monitor.  No plans for further evaluation at this point.  2 hypertension-patient's blood pressure is elevated; however she ran out of her medications.  We will resume lisinopril 40 mg daily, hydrochlorothiazide 12.5 mg daily and K-Dur 10 mEq daily.  Check potassium and renal function in 1 week.  Follow blood pressure and adjust regimen as needed.  Follow-up primary care for management.  Kirk Ruths, MD

## 2018-08-06 ENCOUNTER — Encounter: Payer: Self-pay | Admitting: Cardiology

## 2018-08-06 ENCOUNTER — Other Ambulatory Visit: Payer: Self-pay

## 2018-08-06 ENCOUNTER — Ambulatory Visit: Payer: BC Managed Care – PPO | Admitting: Cardiology

## 2018-08-06 VITALS — BP 146/100 | HR 97 | Temp 97.5°F | Ht 63.0 in | Wt 190.0 lb

## 2018-08-06 DIAGNOSIS — I10 Essential (primary) hypertension: Secondary | ICD-10-CM

## 2018-08-06 DIAGNOSIS — R55 Syncope and collapse: Secondary | ICD-10-CM | POA: Diagnosis not present

## 2018-08-06 MED ORDER — HYDROCHLOROTHIAZIDE 12.5 MG PO CAPS: 12.5000 mg | ORAL_CAPSULE | Freq: Every day | ORAL | 3 refills | Status: DC

## 2018-08-06 MED ORDER — POTASSIUM CHLORIDE ER 10 MEQ PO TBCR: 10.0000 meq | EXTENDED_RELEASE_TABLET | Freq: Every day | ORAL | 3 refills | Status: DC

## 2018-08-06 MED ORDER — LISINOPRIL 40 MG PO TABS: 40.0000 mg | ORAL_TABLET | Freq: Every day | ORAL | 3 refills | Status: DC

## 2018-08-06 NOTE — Patient Instructions (Signed)
Medication Instructions:  RESTART LISINOPRIL 40 MG ONCE DAILY  RESTART HCTZ 12.5 MG ONCE DAILY  RESTART POTASSIUM CHLORIDE 10 MEQ ONCE DAILY  If you need a refill on your cardiac medications before your next appointment, please call your pharmacy.   Lab work: Your physician recommends that you return for lab work in: [place]  If you have labs (blood work) drawn today and your tests are completely normal, you will receive your results only by: Marland Kitchen MyChart Message (if you have MyChart) OR . A paper copy in the mail If you have any lab test that is abnormal or we need to change your treatment, we will call you to review the results.  Follow-Up: At [facility], you and your health needs are our priority.  As part of our continuing mission to provide you with exceptional heart care, we have created designated Provider Care Teams.  These Care Teams include your primary Cardiologist (physician) and Advanced Practice Providers (APPs -  Physician Assistants and Nurse Practitioners) who all work together to provide you with the care you need, when you need it. Your physician recommends that you schedule a follow-up appointment in: AS NEEDED

## 2018-08-13 DIAGNOSIS — I10 Essential (primary) hypertension: Secondary | ICD-10-CM | POA: Diagnosis not present

## 2018-08-14 ENCOUNTER — Encounter: Payer: Self-pay | Admitting: Family Medicine

## 2018-08-14 ENCOUNTER — Encounter: Payer: Self-pay | Admitting: *Deleted

## 2018-08-14 ENCOUNTER — Ambulatory Visit: Payer: BC Managed Care – PPO | Admitting: Family Medicine

## 2018-08-14 ENCOUNTER — Other Ambulatory Visit: Payer: Self-pay

## 2018-08-14 VITALS — BP 130/82 | HR 106 | Temp 96.4°F | Ht 63.0 in | Wt 186.1 lb

## 2018-08-14 DIAGNOSIS — G3184 Mild cognitive impairment, so stated: Secondary | ICD-10-CM

## 2018-08-14 DIAGNOSIS — M542 Cervicalgia: Secondary | ICD-10-CM

## 2018-08-14 DIAGNOSIS — M545 Low back pain, unspecified: Secondary | ICD-10-CM

## 2018-08-14 DIAGNOSIS — R51 Headache: Secondary | ICD-10-CM

## 2018-08-14 DIAGNOSIS — R519 Headache, unspecified: Secondary | ICD-10-CM | POA: Insufficient documentation

## 2018-08-14 DIAGNOSIS — M65322 Trigger finger, left index finger: Secondary | ICD-10-CM | POA: Diagnosis not present

## 2018-08-14 DIAGNOSIS — I10 Essential (primary) hypertension: Secondary | ICD-10-CM | POA: Diagnosis not present

## 2018-08-14 DIAGNOSIS — Z87898 Personal history of other specified conditions: Secondary | ICD-10-CM

## 2018-08-14 LAB — BASIC METABOLIC PANEL
BUN/Creatinine Ratio: 15 (ref 12–28)
BUN: 19 mg/dL (ref 8–27)
CO2: 27 mmol/L (ref 20–29)
Calcium: 10 mg/dL (ref 8.7–10.3)
Chloride: 102 mmol/L (ref 96–106)
Creatinine, Ser: 1.26 mg/dL — ABNORMAL HIGH (ref 0.57–1.00)
GFR calc Af Amer: 53 mL/min/{1.73_m2} — ABNORMAL LOW (ref >59)
GFR calc non Af Amer: 46 mL/min/{1.73_m2} — ABNORMAL LOW (ref >59)
Glucose: 121 mg/dL — ABNORMAL HIGH (ref 65–99)
Potassium: 4.1 mmol/L (ref 3.5–5.2)
Sodium: 143 mmol/L (ref 134–144)

## 2018-08-14 MED ORDER — GABAPENTIN 100 MG PO CAPS: 100.0000 mg | ORAL_CAPSULE | Freq: Three times a day (TID) | ORAL | 3 refills | Status: DC
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
## 2018-08-14 NOTE — Patient Instructions (Signed)
Start gabapentin 100 mg three times daily  It may make you a little sleepy /spacey to start - you should adjust quickly  If any intolerable side effects - stop it and let me know   In 2-3 weeks - call us with an update re: how you are doing  I will most likely go up on the dose at this time  This is for headaches , and also may help the neck and back pain  Follow up with orthopedics to discuss further treatment for back and neck  I will work on disability paperwork
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
## 2018-08-14 NOTE — Assessment & Plan Note (Signed)
bp in fair control at this time  BP Readings from Last 1 Encounters:  08/14/18 130/82   No changes needed Most recent labs reviewed  Disc lifstyle change with low sodium diet and exercise

## 2018-08-14 NOTE — Assessment & Plan Note (Signed)
Ongoing  L arm pain /radiculopathy better with PT Otherwise severe at times and disabling May also be related to headaches Seeing orthopedics Considering epidural injection   Trial of gabapentin today for HA- may also help neck and back pai n

## 2018-08-14 NOTE — Assessment & Plan Note (Signed)
Vagal S/p cardiology eval

## 2018-08-14 NOTE — Assessment & Plan Note (Signed)
Ongoing /severe Limiting work (cannot lift/push/pull/stand)  Walking and sitting-tolerated briefly Will f/u with orthopedics  Deg disc dz  Considering epidural injection  Forms for work filled out

## 2018-08-21 ENCOUNTER — Telehealth: Payer: Self-pay | Admitting: Family Medicine

## 2018-08-21 NOTE — Telephone Encounter (Signed)
Done and in IN box 

## 2018-08-21 NOTE — Telephone Encounter (Signed)
Pt dropped off [employer] Long term disability claim form She stated she forgot to give you page  In dr tower's in box For review

## 2018-08-21 NOTE — Telephone Encounter (Signed)
Left message asking pt to call office Please let pt know her paperwork has been completed and a copy is here for her.  I have not faxed this paperwork.  Pt will need to turn in  Copy for pt Copy for scan

## 2018-08-31 DIAGNOSIS — M542 Cervicalgia: Secondary | ICD-10-CM | POA: Diagnosis not present

## 2018-09-10 DIAGNOSIS — M47812 Spondylosis without myelopathy or radiculopathy, cervical region: Secondary | ICD-10-CM | POA: Diagnosis not present

## 2018-09-17 DIAGNOSIS — M62838 Other muscle spasm: Secondary | ICD-10-CM | POA: Diagnosis not present

## 2018-09-17 DIAGNOSIS — S161XXD Strain of muscle, fascia and tendon at neck level, subsequent encounter: Secondary | ICD-10-CM | POA: Diagnosis not present

## 2018-09-19 DIAGNOSIS — S161XXD Strain of muscle, fascia and tendon at neck level, subsequent encounter: Secondary | ICD-10-CM | POA: Diagnosis not present

## 2018-09-19 DIAGNOSIS — M62838 Other muscle spasm: Secondary | ICD-10-CM | POA: Diagnosis not present

## 2018-09-26 DIAGNOSIS — S161XXD Strain of muscle, fascia and tendon at neck level, subsequent encounter: Secondary | ICD-10-CM | POA: Diagnosis not present

## 2018-09-26 DIAGNOSIS — M62838 Other muscle spasm: Secondary | ICD-10-CM | POA: Diagnosis not present

## 2018-10-02 DIAGNOSIS — S161XXD Strain of muscle, fascia and tendon at neck level, subsequent encounter: Secondary | ICD-10-CM | POA: Diagnosis not present

## 2018-10-02 DIAGNOSIS — M62838 Other muscle spasm: Secondary | ICD-10-CM | POA: Diagnosis not present

## 2018-10-04 DIAGNOSIS — M62838 Other muscle spasm: Secondary | ICD-10-CM | POA: Diagnosis not present

## 2018-10-04 DIAGNOSIS — S161XXD Strain of muscle, fascia and tendon at neck level, subsequent encounter: Secondary | ICD-10-CM | POA: Diagnosis not present

## 2018-10-09 DIAGNOSIS — S161XXD Strain of muscle, fascia and tendon at neck level, subsequent encounter: Secondary | ICD-10-CM | POA: Diagnosis not present

## 2018-10-09 DIAGNOSIS — M62838 Other muscle spasm: Secondary | ICD-10-CM | POA: Diagnosis not present

## 2018-10-11 DIAGNOSIS — S161XXD Strain of muscle, fascia and tendon at neck level, subsequent encounter: Secondary | ICD-10-CM | POA: Diagnosis not present

## 2018-10-11 DIAGNOSIS — M62838 Other muscle spasm: Secondary | ICD-10-CM | POA: Diagnosis not present

## 2018-10-16 DIAGNOSIS — M47812 Spondylosis without myelopathy or radiculopathy, cervical region: Secondary | ICD-10-CM | POA: Diagnosis not present

## 2018-11-12 DIAGNOSIS — M47812 Spondylosis without myelopathy or radiculopathy, cervical region: Secondary | ICD-10-CM | POA: Diagnosis not present

## 2019-05-04 ENCOUNTER — Other Ambulatory Visit: Payer: Self-pay | Admitting: Family Medicine

## 2019-08-05 ENCOUNTER — Other Ambulatory Visit: Payer: Self-pay | Admitting: Cardiology

## 2019-08-05 ENCOUNTER — Other Ambulatory Visit: Payer: Self-pay | Admitting: Family Medicine

## 2019-08-05 DIAGNOSIS — I10 Essential (primary) hypertension: Secondary | ICD-10-CM

## 2019-08-06 NOTE — Telephone Encounter (Signed)
Pt hasn't been seen in almost a year and no future appts., please advise  

## 2019-08-06 NOTE — Telephone Encounter (Signed)
Please schedule f/u or PE (her choice) and refill until then 

## 2019-08-06 NOTE — Telephone Encounter (Signed)
Med refilled once and Lyla Son will reach out to pt to try and get CPE/ F/u scheduled

## 2019-08-06 NOTE — Telephone Encounter (Signed)
I left a message on patient's voice mail to return my call.  Patient needs to schedule a follow up or physical with Dr.Tower.

## 2019-08-07 ENCOUNTER — Other Ambulatory Visit: Payer: Self-pay

## 2019-08-07 ENCOUNTER — Ambulatory Visit: Payer: Self-pay | Admitting: Family Medicine

## 2019-08-07 ENCOUNTER — Encounter: Payer: Self-pay | Admitting: Family Medicine

## 2019-08-07 VITALS — BP 139/88 | HR 91 | Temp 97.7°F | Ht 63.0 in | Wt 189.0 lb

## 2019-08-07 DIAGNOSIS — I10 Essential (primary) hypertension: Secondary | ICD-10-CM

## 2019-08-07 DIAGNOSIS — N1831 Chronic kidney disease, stage 3a: Secondary | ICD-10-CM

## 2019-08-07 DIAGNOSIS — E6609 Other obesity due to excess calories: Secondary | ICD-10-CM

## 2019-08-07 DIAGNOSIS — R7303 Prediabetes: Secondary | ICD-10-CM | POA: Insufficient documentation

## 2019-08-07 DIAGNOSIS — Z6833 Body mass index (BMI) 33.0-33.9, adult: Secondary | ICD-10-CM

## 2019-08-07 MED ORDER — HYDROCHLOROTHIAZIDE 12.5 MG PO CAPS: 12.5000 mg | ORAL_CAPSULE | Freq: Every day | ORAL | 3 refills | Status: DC

## 2019-08-07 MED ORDER — POTASSIUM CHLORIDE ER 10 MEQ PO TBCR: 10.0000 meq | EXTENDED_RELEASE_TABLET | Freq: Every day | ORAL | 3 refills | Status: DC

## 2019-08-07 MED ORDER — LISINOPRIL 40 MG PO TABS: 40.0000 mg | ORAL_TABLET | Freq: Every day | ORAL | 3 refills | Status: DC

## 2019-08-07 NOTE — Progress Notes (Signed)
Subjective:    Patient ID: Lorn Junes, female    DOB: 10-Jan-1956, 64 y.o.   MRN: [id number]  This visit occurred during the SARS-CoV-2 public health emergency.  Safety protocols were in place, including screening questions prior to the visit, additional usage of staff PPE, and extensive cleaning of exam room while observing appropriate contact time as indicated for disinfecting solutions.    HPI  Here for f/u of chronic health problems   Wt Readings from Last 3 Encounters:  08/07/19 189 lb (85.7 kg)  08/14/18 186 lb 2 oz (84.4 kg)  08/06/18 190 lb (86.2 kg)   33.48 kg/m  Doing ok overall  Stays at home/not doing a lot  Still deals with some neck and L arm problems (her hand is still messed up)   She eats light and occ snacks  Good with fruit and vegetables   HTN bp is stable today  No cp or palpitations or headaches or edema  No side effects to medicines  BP Readings from Last 3 Encounters:  08/07/19 140/88  08/14/18 130/82  08/06/18 (!) 146/100    Took last doses on Sunday  Takes hctz 12.5 mg  Lisinopril 40 mg  Klor con 10 meq   No highs or lows that she knows of  She is very compliant with her medication   Drinks a lot of water  Usually takes it with her everywhere-makes a good effort   Walks around in house for exercise - regular activities  Does not go out very much  Walks outdoors if weather is better     Lab Results  Component Value Date   CREATININE 1.26 (H) 08/13/2018   BUN 19 08/13/2018   NA 143 08/13/2018   K 4.1 08/13/2018   CL 102 08/13/2018   CO2 27 08/13/2018   H/o CKD 3  Per pt good fluid intake   One day last week had some cramping in her feet-wants to check her K   Also h/o prediabetes Lab Results  Component Value Date   HGBA1C 6.4 (H) 03/09/2018   Lab Results  Component Value Date   CHOL 199 03/09/2018   HDL 61 03/09/2018   LDLCALC 132 (H) 03/09/2018   TRIG 31 03/09/2018   CHOLHDL 3.3 03/09/2018   Wants to check her  cholesterol today  covid status -has not had vaccine-she is thinking about it    Pap-thinks is up to date   Menstrual hx -hysterectomy  Mammogram 3/20 -has not had one this year yet  Self breast exam   Flu shots - declines  Tdap 7/12 Zoster status - never had shingles or vaccine No insurance -cannot afford shingrix   Colonoscopy 12/15 with 10 y recall   utd hep C and HIV screen   Patient Active Problem List   Diagnosis Date Noted  . Prediabetes 08/07/2019  . Mild cognitive impairment with memory loss 08/14/2018  . Headache disorder 08/14/2018  . Neck pain 03/14/2018  . Stress reaction 03/13/2018  . Muscle soreness 03/13/2018  . Headache 03/13/2018  . Hypothermia 03/09/2018  . CKD (chronic kidney disease), stage III 03/09/2018  . Right knee pain 03/22/2017  . Lumbar pain 03/22/2017  . Encounter for screening mammogram for breast cancer 12/29/2016  . Need for hepatitis C screening test 01/11/2016  . Screening for HIV (human immunodeficiency virus) 01/11/2016  . Obesity 07/01/2014  . Elevated serum creatinine 06/17/2014  . H/O syncope 11/22/2013  . Other screening mammogram 08/23/2010  . Special screening  for malignant neoplasms, colon 08/23/2010  . Routine general medical examination at a health care facility 08/17/2010  . HYPOKALEMIA 03/12/2009  . HYPERTENSION, BENIGN ESSENTIAL 01/22/2009   Past Medical History:  Diagnosis Date  . Abnormal urinalysis 06/18/2014  . Elevated serum creatinine 06/17/2014   With GFR of 53   . Hypertension   . Hypokalemia   . Inflamed skin tag 06/14/2013  . Need for hepatitis C screening test 01/11/2016  . Obesity 07/01/2014   Past Surgical History:  Procedure Laterality Date  . CESAREAN SECTION  1987  . PARTIAL HYSTERECTOMY     for cyst, has one ovary remaining (no hx of abn pap smears)  . TUBAL LIGATION     Social History   Tobacco Use  . Smoking status: Never Smoker  . Smokeless tobacco: Never Used  Substance Use Topics  .  Alcohol use: No    Alcohol/week: 0.0 standard drinks  . Drug use: No   Family History  Problem Relation Age of Onset  . Hypertension Mother   . Asthma Father   . COPD Father        cigars  . Lung cancer Father   . Breast cancer Sister   . Diabetes Sister        mild  . Breast cancer Sister   . Other Other        ? heart problems- distant family  . Mental illness Other        ? in family   No Known Allergies No current outpatient medications on file prior to visit.   No current facility-administered medications on file prior to visit.    Review of Systems  Constitutional: Negative for activity change, appetite change, fatigue, fever and unexpected weight change.  HENT: Negative for congestion, ear pain, rhinorrhea, sinus pressure and sore throat.   Eyes: Negative for pain, redness and visual disturbance.  Respiratory: Negative for cough, shortness of breath and wheezing.   Cardiovascular: Negative for chest pain and palpitations.  Gastrointestinal: Negative for abdominal pain, blood in stool, constipation and diarrhea.  Endocrine: Negative for polydipsia and polyuria.  Genitourinary: Negative for dysuria, frequency and urgency.  Musculoskeletal: Positive for neck pain. Negative for arthralgias, back pain and myalgias.       Chronic neck and arm pain  Says L hand stays swollen since her accident  Skin: Negative for pallor and rash.  Allergic/Immunologic: Negative for environmental allergies.  Neurological: Negative for dizziness, syncope and headaches.  Hematological: Negative for adenopathy. Does not bruise/bleed easily.  Psychiatric/Behavioral: Negative for decreased concentration and dysphoric mood. The patient is not nervous/anxious.        Objective:   Physical Exam Constitutional:      General: She is not in acute distress.    Appearance: Normal appearance. She is well-developed. She is obese. She is not ill-appearing or diaphoretic.  HENT:     Head:  Normocephalic and atraumatic.     Mouth/Throat:     Mouth: Mucous membranes are moist.  Eyes:     General: No scleral icterus.    Conjunctiva/sclera: Conjunctivae normal.     Pupils: Pupils are equal, round, and reactive to light.  Neck:     Thyroid: No thyromegaly.     Vascular: No carotid bruit or JVD.  Cardiovascular:     Rate and Rhythm: Normal rate and regular rhythm.     Heart sounds: Normal heart sounds. No gallop.   Pulmonary:     Effort: Pulmonary effort is normal.  No respiratory distress.     Breath sounds: Normal breath sounds. No wheezing or rales.  Abdominal:     General: Bowel sounds are normal. There is no distension or abdominal bruit.     Palpations: Abdomen is soft. There is no mass.     Tenderness: There is no abdominal tenderness.  Musculoskeletal:     Cervical back: Neck supple.  Lymphadenopathy:     Cervical: No cervical adenopathy.  Skin:    General: Skin is warm and dry.     Findings: No rash.  Neurological:     Mental Status: She is alert.     Sensory: No sensory deficit.     Coordination: Coordination normal.     Deep Tendon Reflexes: Reflexes are normal and symmetric.  Psychiatric:        Mood and Affect: Mood normal.           Assessment & Plan:   Problem List Items Addressed This Visit      Cardiovascular and Mediastinum   HYPERTENSION, BENIGN ESSENTIAL - Primary    bp in fair control at this time  BP Readings from Last 1 Encounters:  08/07/19 139/88  (did not take medicine today so numbers are better at home)  No changes needed Most recent labs reviewed and labs ordered today Disc lifstyle change with low sodium diet and exercise        Relevant Medications   hydrochlorothiazide (MICROZIDE) 12.5 MG capsule   lisinopril (ZESTRIL) 40 MG tablet     Genitourinary   CKD (chronic kidney disease), stage III    Labs today  Pt states she drinks more water now  Continues low dose hctz and lisinopril      Relevant Orders    Comprehensive metabolic panel     Other   Obesity    Discussed how this problem influences overall health and the risks it imposes  Reviewed plan for weight loss with lower calorie diet (via better food choices and also portion control or program like weight watchers) and exercise building up to or more than 30 minutes 5 days per week including some aerobic activity         Prediabetes    A1C today disc imp of low glycemic diet and wt loss to prevent DM2  Pt is sedentary and could use more exercise at home-disc options      Relevant Orders   Hemoglobin A1c    Other Visit Diagnoses    Essential hypertension       Relevant Medications   hydrochlorothiazide (MICROZIDE) 12.5 MG capsule   lisinopril (ZESTRIL) 40 MG tablet   Other Relevant Orders   CBC with Differential/Platelet   Comprehensive metabolic panel   Lipid panel   TSH

## 2019-08-07 NOTE — Assessment & Plan Note (Signed)
Labs today  Pt states she drinks more water now  Continues low dose hctz and lisinopril

## 2019-08-07 NOTE — Assessment & Plan Note (Signed)
bp in fair control at this time  BP Readings from Last 1 Encounters:  08/07/19 139/88  (did not take medicine today so numbers are better at home)  No changes needed Most recent labs reviewed and labs ordered today Disc lifstyle change with low sodium diet and exercise

## 2019-08-07 NOTE — Assessment & Plan Note (Signed)
A1C today disc imp of low glycemic diet and wt loss to prevent DM2  Pt is sedentary and could use more exercise at home-disc options

## 2019-08-07 NOTE — Patient Instructions (Addendum)
Try to exercise every day  Try to get most of your carbohydrates from produce (with the exception of white potatoes)  Eat less bread/pasta/rice/snack foods/cereals/sweets and other items from the middle of the grocery store (processed carbs)  It is better to eat a balanced diet than take a multi vitamin  Calcium and vitamin D are good for your bones however   Labs today   I recommend getting the covid vaccine when you can  You are due for a mammogram  Call [place] about payment plan /discount for people without insurance

## 2019-08-07 NOTE — Assessment & Plan Note (Signed)
Discussed how this problem influences overall health and the risks it imposes  Reviewed plan for weight loss with lower calorie diet (via better food choices and also portion control or program like weight watchers) and exercise building up to or more than 30 minutes 5 days per week including some aerobic activity    

## 2019-08-08 LAB — LIPID PANEL
Cholesterol: 169 mg/dL (ref 0–200)
HDL: 45.6 mg/dL (ref >39.00)
LDL Cholesterol: 98 mg/dL (ref 0–99)
NonHDL: 123.26
Total CHOL/HDL Ratio: 4
Triglycerides: 128 mg/dL (ref 0.0–149.0)
VLDL: 25.6 mg/dL (ref 0.0–40.0)

## 2019-08-08 LAB — CBC WITH DIFFERENTIAL/PLATELET
Basophils Absolute: 0.1 10*3/uL (ref 0.0–0.1)
Basophils Relative: 1.2 % (ref 0.0–3.0)
Eosinophils Absolute: 0.3 10*3/uL (ref 0.0–0.7)
Eosinophils Relative: 5.2 % — ABNORMAL HIGH (ref 0.0–5.0)
HCT: 38.7 % (ref 36.0–46.0)
Hemoglobin: 13 g/dL (ref 12.0–15.0)
Lymphocytes Relative: 33 % (ref 12.0–46.0)
Lymphs Abs: 1.7 10*3/uL (ref 0.7–4.0)
MCHC: 33.5 g/dL (ref 30.0–36.0)
MCV: 90.6 fl (ref 78.0–100.0)
Monocytes Absolute: 0.4 10*3/uL (ref 0.1–1.0)
Monocytes Relative: 8.4 % (ref 3.0–12.0)
Neutro Abs: 2.7 10*3/uL (ref 1.4–7.7)
Neutrophils Relative %: 52.2 % (ref 43.0–77.0)
Platelets: 231 10*3/uL (ref 150.0–400.0)
RBC: 4.28 Mil/uL (ref 3.87–5.11)
RDW: 13.7 % (ref 11.5–15.5)
WBC: 5.3 10*3/uL (ref 4.0–10.5)

## 2019-08-08 LAB — COMPREHENSIVE METABOLIC PANEL
ALT: 17 U/L (ref 0–35)
AST: 17 U/L (ref 0–37)
Albumin: 4.3 g/dL (ref 3.5–5.2)
Alkaline Phosphatase: 50 U/L (ref 39–117)
BUN: 14 mg/dL (ref 6–23)
CO2: 31 mEq/L (ref 19–32)
Calcium: 9.6 mg/dL (ref 8.4–10.5)
Chloride: 106 mEq/L (ref 96–112)
Creatinine, Ser: 1.4 mg/dL — ABNORMAL HIGH (ref 0.40–1.20)
GFR: 45.83 mL/min — ABNORMAL LOW (ref >60.00)
Glucose, Bld: 102 mg/dL — ABNORMAL HIGH (ref 70–99)
Potassium: 4.4 mEq/L (ref 3.5–5.1)
Sodium: 144 mEq/L (ref 135–145)
Total Bilirubin: 0.4 mg/dL (ref 0.2–1.2)
Total Protein: 6.8 g/dL (ref 6.0–8.3)

## 2019-08-08 LAB — TSH: TSH: 3.35 u[IU]/mL (ref 0.35–4.50)

## 2019-08-08 LAB — HEMOGLOBIN A1C: Hgb A1c MFr Bld: 6.9 % — ABNORMAL HIGH (ref 4.6–6.5)

## 2019-08-27 ENCOUNTER — Encounter: Payer: Self-pay | Admitting: Family Medicine

## 2019-08-27 ENCOUNTER — Other Ambulatory Visit: Payer: Self-pay

## 2019-08-27 ENCOUNTER — Ambulatory Visit: Payer: Self-pay | Admitting: Family Medicine

## 2019-08-27 VITALS — BP 150/92 | HR 80 | Temp 97.5°F | Ht 63.0 in | Wt 187.2 lb

## 2019-08-27 DIAGNOSIS — N1831 Chronic kidney disease, stage 3a: Secondary | ICD-10-CM

## 2019-08-27 DIAGNOSIS — I10 Essential (primary) hypertension: Secondary | ICD-10-CM

## 2019-08-27 DIAGNOSIS — Z6833 Body mass index (BMI) 33.0-33.9, adult: Secondary | ICD-10-CM

## 2019-08-27 DIAGNOSIS — R7303 Prediabetes: Secondary | ICD-10-CM

## 2019-08-27 DIAGNOSIS — E6609 Other obesity due to excess calories: Secondary | ICD-10-CM

## 2019-08-27 DIAGNOSIS — E66811 Obesity, class 1: Secondary | ICD-10-CM

## 2019-08-27 MED ORDER — AMLODIPINE BESYLATE 5 MG PO TABS: 5.0000 mg | ORAL_TABLET | Freq: Every day | ORAL | 5 refills | Status: DC

## 2019-08-27 NOTE — Assessment & Plan Note (Signed)
A1C is not in the diabetic range - 6.9  Discussed reasons to get glucose down Disc low glycemic diet in great detail along with lean protein sources  Urged to continue exercise and work on wt loss  W/o insurance-cannot afford diabetic education unfortunately  Handouts given  F/u in 3 mo

## 2019-08-27 NOTE — Progress Notes (Signed)
Subjective:    Patient ID: Kristin Barton, female    DOB: 1956/01/19, 64 y.o.   MRN: [id number]  This visit occurred during the SARS-CoV-2 public health emergency.  Safety protocols were in place, including screening questions prior to the visit, additional usage of staff PPE, and extensive cleaning of exam room while observing appropriate contact time as indicated for disinfecting solutions.    HPI Pt presents for f/u of chronic health problems  Wt Readings from Last 3 Encounters:  08/27/19 187 lb 4 oz (84.9 kg)  08/07/19 189 lb (85.7 kg)  08/14/18 186 lb 2 oz (84.4 kg)   33.17 kg/m  HTN bp is stable today  No cp or palpitations or headaches or edema  No side effects to medicines  BP Readings from Last 3 Encounters:  08/27/19 (!) 150/92  08/07/19 139/88  08/14/18 130/82     Pulse Readings from Last 3 Encounters:  08/27/19 80  08/07/19 91  08/14/18 (!) 106  inst her to hold the hctz and K due to inc cr  inst to continue lisinopril Also avoid nsaids    Last renal labs  Lab Results  Component Value Date   CREATININE 1.40 (H) 08/07/2019   BUN 14 08/07/2019   NA 144 08/07/2019   K 4.4 08/07/2019   CL 106 08/07/2019   CO2 31 08/07/2019   Prediabetes- last a1c was up into the diabetes range Lab Results  Component Value Date   HGBA1C 6.9 (H) 08/07/2019   Exercising - she does calisthenics /floor work  Also walks in the house and lots of stairs in the house   Drinking plenty of water - always good about that   Diet - improved  Avoiding bread except for sandwich (not often)  Pasta-avoiding  Sweets-infrequently  Avoids sweet drinks   Loves fruit-apples and oranges  She likes green beans/peas/okra   Loves salads and chicken  Likes fish  Likes nuts   Hard to eat different from family   Patient Active Problem List   Diagnosis Date Noted  . Prediabetes 08/07/2019  . Mild cognitive impairment with memory loss 08/14/2018  . Headache disorder 08/14/2018    . Neck pain 03/14/2018  . Stress reaction 03/13/2018  . Muscle soreness 03/13/2018  . Headache 03/13/2018  . CKD (chronic kidney disease), stage III 03/09/2018  . Right knee pain 03/22/2017  . Lumbar pain 03/22/2017  . Encounter for screening mammogram for breast cancer 12/29/2016  . Need for hepatitis C screening test 01/11/2016  . Screening for HIV (human immunodeficiency virus) 01/11/2016  . Obesity 07/01/2014  . Elevated serum creatinine 06/17/2014  . H/O syncope 11/22/2013  . Other screening mammogram 08/23/2010  . Special screening for malignant neoplasms, colon 08/23/2010  . Routine general medical examination at a health care facility 08/17/2010  . HYPERTENSION, BENIGN ESSENTIAL 01/22/2009   Past Medical History:  Diagnosis Date  . Abnormal urinalysis 06/18/2014  . Elevated serum creatinine 06/17/2014   With GFR of 53   . Hypertension   . Hypokalemia   . Inflamed skin tag 06/14/2013  . Need for hepatitis C screening test 01/11/2016  . Obesity 07/01/2014   Past Surgical History:  Procedure Laterality Date  . CESAREAN SECTION  1987  . PARTIAL HYSTERECTOMY     for cyst, has one ovary remaining (no hx of abn pap smears)  . TUBAL LIGATION     Social History   Tobacco Use  . Smoking status: Never Smoker  . Smokeless tobacco: Never  Used  Substance Use Topics  . Alcohol use: No    Alcohol/week: 0.0 standard drinks  . Drug use: No   Family History  Problem Relation Age of Onset  . Hypertension Mother   . Asthma Father   . COPD Father        cigars  . Lung cancer Father   . Breast cancer Sister   . Diabetes Sister        mild  . Breast cancer Sister   . Other Other        ? heart problems- distant family  . Mental illness Other        ? in family   No Known Allergies Current Outpatient Medications on File Prior to Visit  Medication Sig Dispense Refill  . lisinopril (ZESTRIL) 40 MG tablet Take 1 tablet (40 mg total) by mouth daily. 90 tablet 3   No  current facility-administered medications on file prior to visit.    Review of Systems  Constitutional: Negative for activity change, appetite change, fatigue, fever and unexpected weight change.  HENT: Negative for congestion, ear pain, rhinorrhea, sinus pressure and sore throat.   Eyes: Negative for pain, redness and visual disturbance.  Respiratory: Negative for cough, shortness of breath and wheezing.   Cardiovascular: Negative for chest pain and palpitations.  Gastrointestinal: Negative for abdominal pain, blood in stool, constipation and diarrhea.  Endocrine: Negative for cold intolerance, heat intolerance, polydipsia, polyphagia and polyuria.  Genitourinary: Negative for dysuria, frequency and urgency.  Musculoskeletal: Negative for arthralgias, back pain and myalgias.  Skin: Negative for pallor and rash.  Allergic/Immunologic: Negative for environmental allergies and food allergies.  Neurological: Negative for dizziness, syncope and headaches.  Hematological: Negative for adenopathy. Does not bruise/bleed easily.  Psychiatric/Behavioral: Negative for decreased concentration and dysphoric mood. The patient is not nervous/anxious.        Objective:   Physical Exam Constitutional:      General: She is not in acute distress.    Appearance: Normal appearance. She is well-developed. She is obese. She is not ill-appearing.  HENT:     Head: Normocephalic and atraumatic.  Eyes:     General: No scleral icterus.    Conjunctiva/sclera: Conjunctivae normal.     Pupils: Pupils are equal, round, and reactive to light.  Neck:     Thyroid: No thyromegaly.     Vascular: No carotid bruit or JVD.  Cardiovascular:     Rate and Rhythm: Normal rate and regular rhythm.     Heart sounds: Normal heart sounds. No gallop.   Pulmonary:     Effort: Pulmonary effort is normal. No respiratory distress.     Breath sounds: Normal breath sounds. No wheezing or rales.  Abdominal:     General: Bowel  sounds are normal. There is no distension or abdominal bruit.     Palpations: Abdomen is soft. There is no mass.     Tenderness: There is no abdominal tenderness.  Musculoskeletal:     Cervical back: Normal range of motion and neck supple. No tenderness.     Right lower leg: No edema.     Left lower leg: No edema.  Lymphadenopathy:     Cervical: No cervical adenopathy.  Skin:    General: Skin is warm and dry.     Coloration: Skin is not pale.     Findings: No erythema or rash.  Neurological:     Mental Status: She is alert.     Sensory: No sensory deficit.  Coordination: Coordination normal.     Deep Tendon Reflexes: Reflexes are normal and symmetric. Reflexes normal.  Psychiatric:        Mood and Affect: Mood normal.           Assessment & Plan:   Problem List Items Addressed This Visit      Cardiovascular and Mediastinum   HYPERTENSION, BENIGN ESSENTIAL    Pt is currently off hctz due to renal insufficiency  Checking renal panel today Adv to continue lisinopril  Adding amlodipine 5 mg daily (call if side eff or problems)  BP: (!) 150/92    F/u 3 mo       Relevant Medications   amLODipine (NORVASC) 5 MG tablet   Other Relevant Orders   Renal function panel     Genitourinary   CKD (chronic kidney disease), stage III    Last cr 1.4  Drinking more water  Stopped hctz   (plan to try amlodipine for bp) Renal panel today       Relevant Orders   Renal function panel     Other   Obesity    Discussed how this problem influences overall health and the risks it imposes  Reviewed plan for weight loss with lower calorie diet (via better food choices and also portion control or program like weight watchers) and exercise building up to or more than 30 minutes 5 days per week including some aerobic activity   Urged strongly to focus on low glycemic diet /disc protein sources      Prediabetes - Primary    A1C is not in the diabetic range - 6.9  Discussed  reasons to get glucose down Disc low glycemic diet in great detail along with lean protein sources  Urged to continue exercise and work on wt loss  W/o insurance-cannot afford diabetic education unfortunately  Handouts given  F/u in 3 mo

## 2019-08-27 NOTE — Patient Instructions (Addendum)
Try to get most of your carbohydrates from produce (with the exception of white potatoes)  Eat less bread/pasta/rice/snack foods/cereals/sweets and other items from the middle of the grocery store (processed carbs)  For protein- lean meat (avoid bacon sausage, beef )  Fish (not shellfish)  Nuts in moderation  Dairy (cheese in moderation) , low fat milk, cottage cheese, yogurt without a lot of sugar -greek is the best  Dried beans - (kidney beans, pintos , black beans)   Look for the book "sugar busters" at [employer] or book store  Keep exercising    Continue the lisinopril  Add amlodipine 5 mg once daily   Labs today -for kidney function  Follow up in 3 months

## 2019-08-27 NOTE — Assessment & Plan Note (Signed)
Discussed how this problem influences overall health and the risks it imposes  Reviewed plan for weight loss with lower calorie diet (via better food choices and also portion control or program like weight watchers) and exercise building up to or more than 30 minutes 5 days per week including some aerobic activity   Urged strongly to focus on low glycemic diet /disc protein sources

## 2019-08-27 NOTE — Assessment & Plan Note (Signed)
Last cr 1.4  Drinking more water  Stopped hctz   (plan to try amlodipine for bp) Renal panel today

## 2019-08-27 NOTE — Assessment & Plan Note (Signed)
Pt is currently off hctz due to renal insufficiency  Checking renal panel today Adv to continue lisinopril  Adding amlodipine 5 mg daily (call if side eff or problems)  BP: (!) 150/92    F/u 3 mo

## 2019-08-28 LAB — RENAL FUNCTION PANEL
Albumin: 4.2 g/dL (ref 3.5–5.2)
BUN: 10 mg/dL (ref 6–23)
CO2: 30 mEq/L (ref 19–32)
Calcium: 9.6 mg/dL (ref 8.4–10.5)
Chloride: 104 mEq/L (ref 96–112)
Creatinine, Ser: 1.24 mg/dL — ABNORMAL HIGH (ref 0.40–1.20)
GFR: 52.71 mL/min — ABNORMAL LOW (ref >60.00)
Glucose, Bld: 97 mg/dL (ref 70–99)
Phosphorus: 4.1 mg/dL (ref 2.3–4.6)
Potassium: 3.4 mEq/L — ABNORMAL LOW (ref 3.5–5.1)
Sodium: 141 mEq/L (ref 135–145)

## 2019-08-29 ENCOUNTER — Encounter: Payer: Self-pay | Admitting: *Deleted

## 2019-11-27 ENCOUNTER — Other Ambulatory Visit: Payer: Self-pay

## 2019-11-27 ENCOUNTER — Ambulatory Visit (INDEPENDENT_AMBULATORY_CARE_PROVIDER_SITE_OTHER): Payer: Medicaid Other | Admitting: Family Medicine

## 2019-11-27 ENCOUNTER — Encounter: Payer: Self-pay | Admitting: Family Medicine

## 2019-11-27 VITALS — BP 139/81 | HR 85 | Temp 97.0°F | Ht 63.0 in | Wt 180.4 lb

## 2019-11-27 DIAGNOSIS — R7303 Prediabetes: Secondary | ICD-10-CM

## 2019-11-27 DIAGNOSIS — I10 Essential (primary) hypertension: Secondary | ICD-10-CM

## 2019-11-27 DIAGNOSIS — R7989 Other specified abnormal findings of blood chemistry: Secondary | ICD-10-CM

## 2019-11-27 LAB — POCT GLYCOSYLATED HEMOGLOBIN (HGB A1C): Hemoglobin A1C: 5.8 % — AB (ref 4.0–5.6)

## 2019-11-27 MED ORDER — AMLODIPINE BESYLATE 5 MG PO TABS: 5.0000 mg | ORAL_TABLET | Freq: Every day | ORAL | 3 refills | Status: DC

## 2019-11-27 NOTE — Progress Notes (Signed)
Subjective:    Patient ID: Kristin Barton, female    DOB: [birth year], 64 y.o.   MRN: [id number]  This visit occurred during the SARS-CoV-2 public health emergency.  Safety protocols were in place, including screening questions prior to the visit, additional usage of staff PPE, and extensive cleaning of exam room while observing appropriate contact time as indicated for disinfecting solutions.    HPI Pt presents for f/u of chronic health problems   Wt Readings from Last 3 Encounters:  11/27/19 180 lb 7 oz (81.8 kg)  08/27/19 187 lb 4 oz (84.9 kg)  08/07/19 189 lb (85.7 kg)   31.96 kg/m Has lost another 7 lb  Drinking lots of water and avoids sugar in general  Eating chicken and fruits/veggies  Nuts for snacks  Avoids potatoes  Doing exercise at home now  Walks stairs for exercise  Not outdoors much /prefers inside -neighborhood is not good for walking  Does not take flu shot  Unsure about the covid vaccine- still debating    HTN  bp is stable today  No cp or palpitations or headaches or edema  No side effects to medicines  BP Readings from Last 3 Encounters:  11/27/19 140/88  08/27/19 (!) 150/92  08/07/19 139/88     Pulse Readings from Last 3 Encounters:  11/27/19 85  08/27/19 80  08/07/19 91     Taking lsinopril 40 mg daily  Last visit added amlodipine 5 mg daily (stopped hctz due to inc cr)  No side effects or problems   Last visit - prediabetes a1c went up to 6.9  We discussed diet control/low glycemic goals and enc wt loss  No insurance to pay for DM education   Lab Results  Component Value Date   HGBA1C 5.8 (A) 11/27/2019  much improvement with diet and exercise  Thrilled!  Feels better also   Chronic kidney disease  Lab Results  Component Value Date   CREATININE 1.24 (H) 08/27/2019   BUN 10 08/27/2019   NA 141 08/27/2019   K 3.4 (L) 08/27/2019   CL 104 08/27/2019   CO2 30 08/27/2019   This was after stopping hctz   Cholesterol Lab  Results  Component Value Date   CHOL 169 08/07/2019   HDL 45.60 08/07/2019   LDLCALC 98 08/07/2019   TRIG 128.0 08/07/2019   CHOLHDL 4 08/07/2019  tries to avoid fatty foods that the rest of the family eats  Patient Active Problem List   Diagnosis Date Noted   Prediabetes 08/07/2019   Mild cognitive impairment with memory loss 08/14/2018   Headache disorder 08/14/2018   Neck pain 03/14/2018   Stress reaction 03/13/2018   Muscle soreness 03/13/2018   Headache 03/13/2018   CKD (chronic kidney disease), stage III (HCC) 03/09/2018   Right knee pain 03/22/2017   Lumbar pain 03/22/2017   Encounter for screening mammogram for breast cancer 12/29/2016   Need for hepatitis C screening test 01/11/2016   Screening for HIV (human immunodeficiency virus) 01/11/2016   Obesity 07/01/2014   Elevated serum creatinine 06/17/2014   H/O syncope 11/22/2013   Other screening mammogram 08/23/2010   Special screening for malignant neoplasms, colon 08/23/2010   Routine general medical examination at a health care facility 08/17/2010   HYPERTENSION, BENIGN ESSENTIAL 01/22/2009   Past Medical History:  Diagnosis Date   Abnormal urinalysis 06/18/2014   Elevated serum creatinine 06/17/2014   With GFR of 53    Hypertension    Hypokalemia  Inflamed skin tag 06/14/2013   Need for hepatitis C screening test 01/11/2016   Obesity 07/01/2014   Past Surgical History:  Procedure Laterality Date   CESAREAN SECTION  1987   PARTIAL HYSTERECTOMY     for cyst, has one ovary remaining (no hx of abn pap smears)   TUBAL LIGATION     Social History   Tobacco Use   Smoking status: Never Smoker   Smokeless tobacco: Never Used  Substance Use Topics   Alcohol use: No    Alcohol/week: 0.0 standard drinks   Drug use: No   Family History  Problem Relation Age of Onset   Hypertension Mother    Asthma Father    COPD Father        cigars   Lung cancer Father    Breast  cancer Sister    Diabetes Sister        mild   Breast cancer Sister    Other Other        ? heart problems- distant family   Mental illness Other        ? in family   No Known Allergies Current Outpatient Medications on File Prior to Visit  Medication Sig Dispense Refill   lisinopril (ZESTRIL) 40 MG tablet Take 1 tablet (40 mg total) by mouth daily. 90 tablet 3   No current facility-administered medications on file prior to visit.    Review of Systems  Constitutional: Negative for activity change, appetite change, fatigue, fever and unexpected weight change.  HENT: Negative for congestion, ear pain, rhinorrhea, sinus pressure and sore throat.   Eyes: Negative for pain, redness and visual disturbance.  Respiratory: Negative for cough, shortness of breath and wheezing.   Cardiovascular: Negative for chest pain and palpitations.  Gastrointestinal: Negative for abdominal pain, blood in stool, constipation and diarrhea.  Endocrine: Negative for polydipsia and polyuria.  Genitourinary: Negative for dysuria, frequency and urgency.  Musculoskeletal: Negative for arthralgias, back pain and myalgias.  Skin: Negative for pallor and rash.  Allergic/Immunologic: Negative for environmental allergies.  Neurological: Negative for dizziness, syncope and headaches.  Hematological: Negative for adenopathy. Does not bruise/bleed easily.  Psychiatric/Behavioral: Negative for decreased concentration and dysphoric mood. The patient is not nervous/anxious.        Objective:   Physical Exam Constitutional:      General: She is not in acute distress.    Appearance: Normal appearance. She is well-developed. She is obese. She is not ill-appearing.  HENT:     Head: Normocephalic and atraumatic.     Mouth/Throat:     Mouth: Mucous membranes are moist.  Eyes:     General: No scleral icterus.    Conjunctiva/sclera: Conjunctivae normal.     Pupils: Pupils are equal, round, and reactive to light.    Neck:     Thyroid: No thyromegaly.     Vascular: No carotid bruit or JVD.  Cardiovascular:     Rate and Rhythm: Normal rate and regular rhythm.     Heart sounds: Normal heart sounds. No gallop.   Pulmonary:     Effort: Pulmonary effort is normal. No respiratory distress.     Breath sounds: Normal breath sounds. No wheezing or rales.  Abdominal:     General: Bowel sounds are normal. There is no distension or abdominal bruit.     Palpations: Abdomen is soft. There is no mass.     Tenderness: There is no abdominal tenderness.  Musculoskeletal:     Cervical back: Normal  range of motion and neck supple.     Right lower leg: No edema.     Left lower leg: No edema.  Lymphadenopathy:     Cervical: No cervical adenopathy.  Skin:    General: Skin is warm and dry.     Findings: No erythema or rash.  Neurological:     Mental Status: She is alert.     Coordination: Coordination normal.     Deep Tendon Reflexes: Reflexes are normal and symmetric. Reflexes normal.  Psychiatric:        Mood and Affect: Mood normal.     Comments: Pleasant            Assessment & Plan:   Problem List Items Addressed This Visit      Cardiovascular and Mediastinum   HYPERTENSION, BENIGN ESSENTIAL    bp in fair control at this time  BP Readings from Last 1 Encounters:  11/27/19 139/81   No changes needed Most recent labs reviewed  Disc lifstyle change with low sodium diet and exercise  Plan to continue lisinopril 40 mg daily and amlodipine 5 mg daily  Off hctz due to cr elevation-will stay off this  F/u 6 mo      Relevant Medications   amLODipine (NORVASC) 5 MG tablet     Other   Elevated serum creatinine    Lab Results  Component Value Date   CREATININE 1.24 (H) 08/27/2019   This improved off the hctz She is also drinking lots of water and avoiding nsaids  Will continue to follow      Prediabetes - Primary    Improved a1c Lab Results  Component Value Date   HGBA1C 5.8 (A)  11/27/2019   This is down from 6.9 - big improvement  disc imp of low glycemic diet and wt loss to prevent DM2  Commended on wt loss and better diet and exercise  Will re check at f/u 6 mo      Relevant Orders   POCT glycosylated hemoglobin (Hb A1C) (Completed)

## 2019-11-27 NOTE — Assessment & Plan Note (Signed)
Improved a1c Lab Results  Component Value Date   HGBA1C 5.8 (A) 11/27/2019   This is down from 6.9 - big improvement  disc imp of low glycemic diet and wt loss to prevent DM2  Commended on wt loss and better diet and exercise  Will re check at f/u 6 mo

## 2019-11-27 NOTE — Patient Instructions (Addendum)
Keep working on [employer] and exercise  Also good water intake for kidney health  Your blood sugar is improved Keep loosing weight - you will feel better and be able to do more   Please schedule your annual exam in 6 months

## 2019-11-27 NOTE — Assessment & Plan Note (Signed)
Lab Results  Component Value Date   CREATININE 1.24 (H) 08/27/2019   This improved off the hctz She is also drinking lots of water and avoiding nsaids  Will continue to follow

## 2019-11-27 NOTE — Assessment & Plan Note (Signed)
bp in fair control at this time  BP Readings from Last 1 Encounters:  11/27/19 139/81   No changes needed Most recent labs reviewed  Disc lifstyle change with low sodium diet and exercise  Plan to continue lisinopril 40 mg daily and amlodipine 5 mg daily  Off hctz due to cr elevation-will stay off this  F/u 6 mo

## 2019-12-16 IMAGING — MG MM DIGITAL SCREENING BILAT W/ CAD
5 series · 5 of 5 positions shown · non-contrast
Comparison: Previous exam(s).

CLINICAL DATA: Screening.

EXAM:
DIGITAL SCREENING BILATERAL MAMMOGRAM WITH CAD

[L CC]
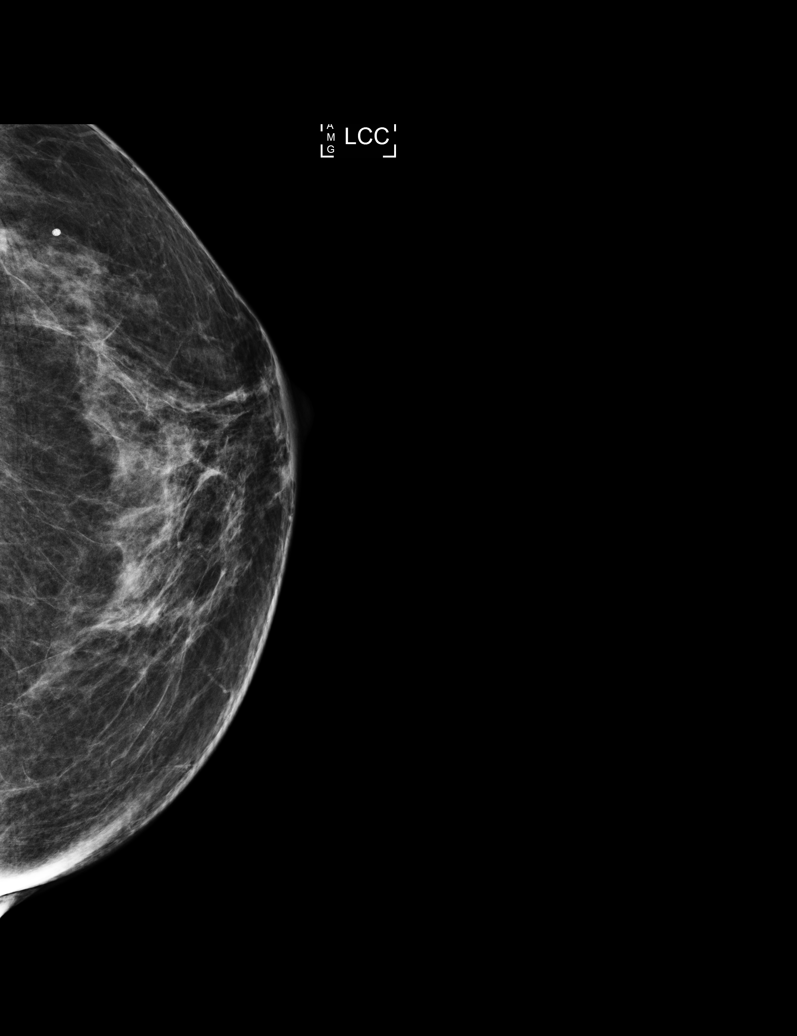

[R MLO]
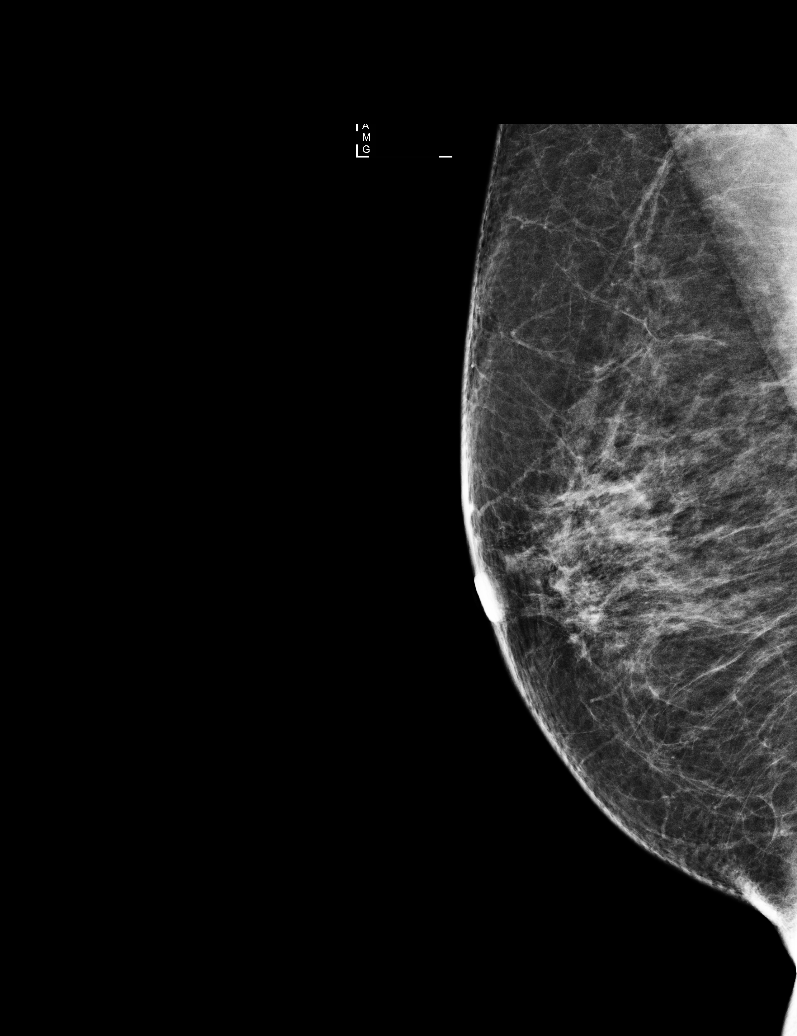

[L MLO]
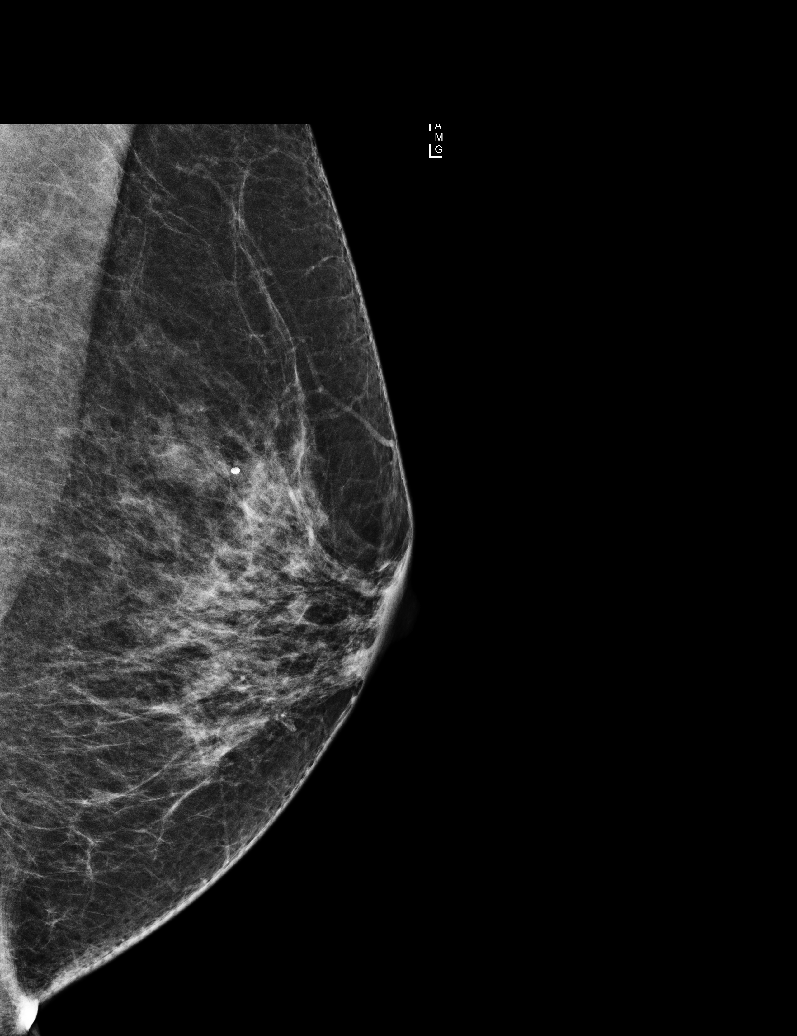

[L XCCL]
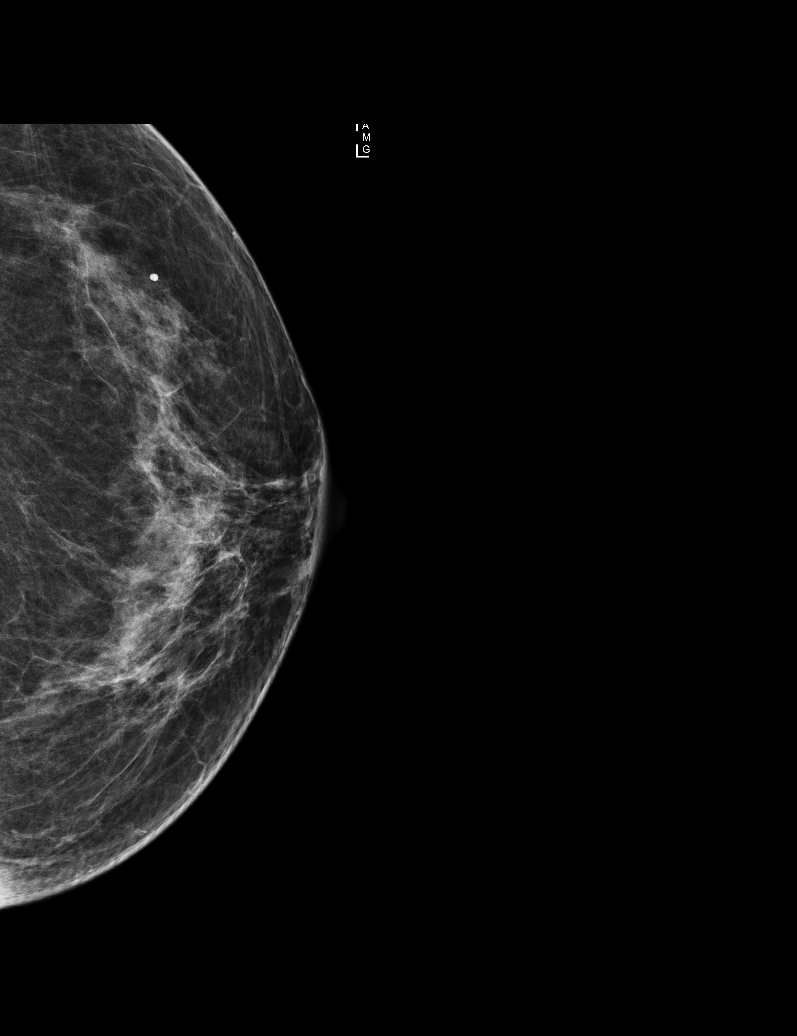

[R CC]
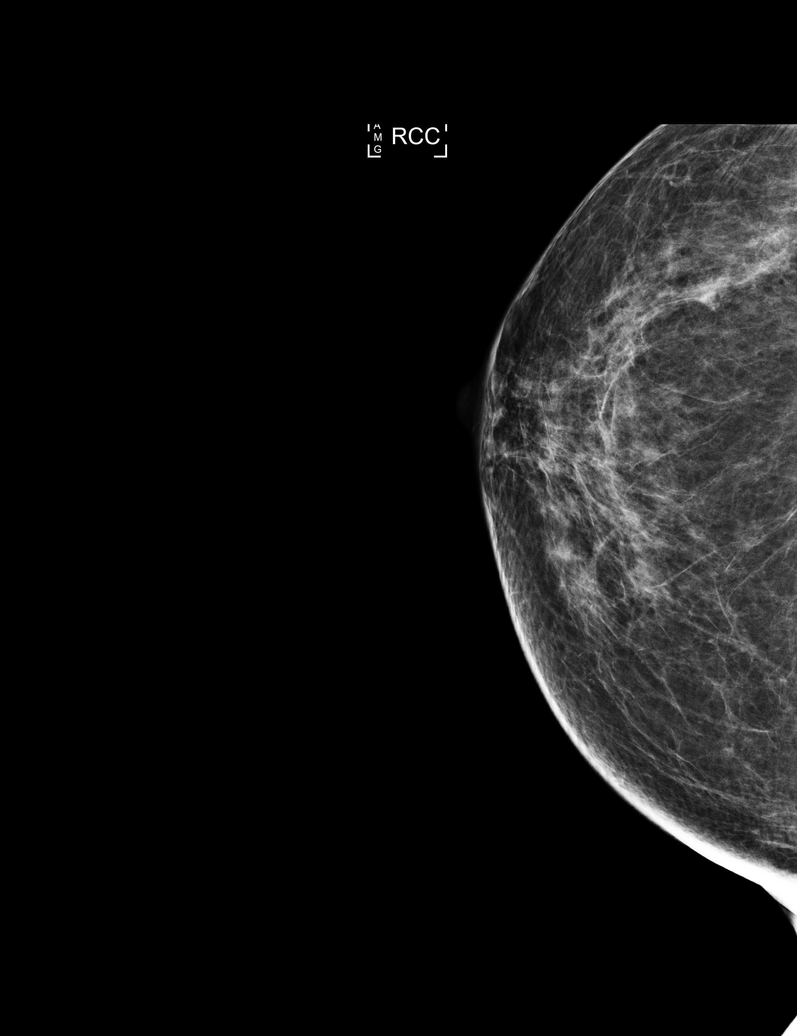

[5 of 5 positions shown; findings below may reference images not displayed]

ACR Breast Density Category c: The breast tissue is heterogeneously
dense, which may obscure small masses.
FINDINGS: In the left breast, a possible asymmetry warrants further
evaluation. In the right breast, no findings suspicious for
malignancy. Images were processed with CAD.
IMPRESSION: Further evaluation is suggested for possible asymmetry in the left
breast.

RECOMMENDATION:
Diagnostic mammogram and possibly ultrasound of the left breast.
(Code:P3-X-WW1)

The patient will be contacted regarding the findings, and additional
imaging will be scheduled.

BI-RADS CATEGORY  0: Incomplete. Need additional imaging evaluation
and/or prior mammograms for comparison.

## 2020-02-20 ENCOUNTER — Telehealth: Payer: Self-pay

## 2020-02-20 ENCOUNTER — Ambulatory Visit (INDEPENDENT_AMBULATORY_CARE_PROVIDER_SITE_OTHER): Payer: 59

## 2020-02-20 ENCOUNTER — Ambulatory Visit (HOSPITAL_COMMUNITY)
Admission: EM | Admit: 2020-02-20 | Discharge: 2020-02-20 | Disposition: A | Discharge status: Home / Self Care | Payer: 59 | Attending: Family Medicine | Admitting: Family Medicine

## 2020-02-20 ENCOUNTER — Encounter (HOSPITAL_COMMUNITY): Payer: Self-pay

## 2020-02-20 ENCOUNTER — Other Ambulatory Visit: Payer: Self-pay

## 2020-02-20 DIAGNOSIS — R059 Cough, unspecified: Secondary | ICD-10-CM | POA: Diagnosis not present

## 2020-02-20 DIAGNOSIS — Z792 Long term (current) use of antibiotics: Secondary | ICD-10-CM | POA: Diagnosis not present

## 2020-02-20 DIAGNOSIS — R053 Chronic cough: Secondary | ICD-10-CM | POA: Diagnosis present

## 2020-02-20 DIAGNOSIS — Z20822 Contact with and (suspected) exposure to covid-19: Secondary | ICD-10-CM | POA: Insufficient documentation

## 2020-02-20 DIAGNOSIS — Z836 Family history of other diseases of the respiratory system: Secondary | ICD-10-CM | POA: Insufficient documentation

## 2020-02-20 DIAGNOSIS — Z79811 Long term (current) use of aromatase inhibitors: Secondary | ICD-10-CM | POA: Diagnosis not present

## 2020-02-20 DIAGNOSIS — Z79899 Other long term (current) drug therapy: Secondary | ICD-10-CM | POA: Insufficient documentation

## 2020-02-20 MED ORDER — BENZONATATE 100 MG PO CAPS: ORAL_CAPSULE | ORAL | 0 refills | Status: DC

## 2020-02-20 MED ORDER — AZITHROMYCIN 250 MG PO TABS: 250.0000 mg | ORAL_TABLET | Freq: Every day | ORAL | 0 refills | Status: DC

## 2020-02-20 NOTE — ED Triage Notes (Signed)
Pt c/o a dry cough that makes her throat sore. She states she has SOB when coughing. She states she is unable to speak much. She states when she speaks she starts to cough.She denies chest pain when coughing.

## 2020-02-20 NOTE — ED Provider Notes (Signed)
[facility]   [id number] 02/20/20 Arrival Time: 1302  ASSESSMENT & PLAN:  1. Persistent cough for 3 weeks or longer      COVID-19 testing sent. See letter/work note on file for self-isolation guidelines. OTC symptom care as needed.  Given duration of symptoms and x-ray findings, begin: Meds ordered this encounter  Medications  . azithromycin (ZITHROMAX) 250 MG tablet    Sig: Take 1 tablet (250 mg total) by mouth daily. Take first 2 tablets together, then 1 every day until finished.    Dispense:  6 tablet    Refill:  0  . benzonatate (TESSALON) 100 MG capsule    Sig: Take 1 capsule by mouth every 8 (eight) hours for cough.    Dispense:  21 capsule    Refill:  0    Follow-up Information    Tower, Audrie Gallus, MD.   Specialties: Family Medicine, Radiology Why: If worsening or failing to improve as anticipated. Contact information: [address] [place] [address] [phone]        [facility] EMERGENCY DEPARTMENT.   Specialty: Emergency Medicine Why: If symptoms worsen in any way. Contact information: [address] [id number] mc [place] [address] [phone]                Reviewed expectations re: course of current medical issues. Questions answered. Outlined signs and symptoms indicating need for more acute intervention. Understanding verbalized. After Visit Summary given.   SUBJECTIVE: History from: patient. Kristin Barton is a 65 y.o. female who reports a persistent non-prod cough; 3 wks at least. Afebrile. No SOB or wheezing. Non-smoker. Denies: fever. Normal PO intake without n/v/d.    OBJECTIVE:  Vitals:   02/20/20 1516  BP: (!) 143/95  Pulse: 93  Resp: 18  Temp: 97.9 F (36.6 C)  TempSrc: Oral  SpO2: 98%    General appearance: alert; no distress Eyes: PERRLA; EOMI; conjunctiva normal HENT: [place]; AT; without nasal congestion Neck: supple  Lungs: speaks full sentences  without difficulty; unlabored; CTAB Extremities: no edema Skin: warm and dry Neurologic: normal gait Psychological: alert and cooperative; normal mood and affect  Labs:  Labs Reviewed  SARS CORONAVIRUS 2 (TAT 6-24 HRS)      No Known Allergies  Past Medical History:  Diagnosis Date  . Abnormal urinalysis 06/18/2014  . Elevated serum creatinine 06/17/2014   With GFR of 53   . Hypertension   . Hypokalemia   . Inflamed skin tag 06/14/2013  . Need for hepatitis C screening test 01/11/2016  . Obesity 07/01/2014   Social History   Socioeconomic History  . Marital status: Divorced    Spouse name: Not on file  . Number of children: 1  . Years of education: 18  . Highest education level: Not on file  Occupational History  . Occupation: Manufacturing engineer co    Employer: [employer]    Comment: 3rd shift  Tobacco Use  . Smoking status: Never Smoker  . Smokeless tobacco: Never Used  Substance and Sexual Activity  . Alcohol use: No    Alcohol/week: 0.0 standard drinks  . Drug use: No  . Sexual activity: Not on file  Other Topics Concern  . Not on file  Social History Narrative   Regular exercise: walking   Cares for new grandchild         Social Determinants of Health   Financial Resource Strain: Not on file  Food Insecurity: Not on file  Transportation Needs: Not on file  Physical Activity: Not on file  Stress: Not on file  Social Connections: Not on file  Intimate Partner Violence: Not on file   Family History  Problem Relation Age of Onset  . Hypertension Mother   . Asthma Father   . COPD Father        cigars  . Lung cancer Father   . Breast cancer Sister   . Diabetes Sister        mild  . Breast cancer Sister   . Other Other        ? heart problems- distant family  . Mental illness Other        ? in family   Past Surgical History:  Procedure Laterality Date  . CESAREAN SECTION  1987  . PARTIAL HYSTERECTOMY     for cyst, has one ovary  remaining (no hx of abn pap smears)  . TUBAL LIGATION       Mardella Layman, MD 02/20/20 [phone]

## 2020-02-20 NOTE — Telephone Encounter (Signed)
Agree with UC Will watch for correspondence

## 2020-02-20 NOTE — Telephone Encounter (Signed)
Pt said starting on 02/05/20 pt woke up with vomiting and vomited several times on 02/05/20; pt stopped vomiting at night on 02/05/20. No vomiting since 02/05/20. Pt also has had h/a on and off since 02/05/20. Dry cough, S/T, a lot of chest congestion and SOB with coughing episodes. No CP or wheezing. Pt has been taking dayquil but continues with above symptoms. When pts daughter gets home will go to [facility] in [place] for eval and possible testing or CXR. FYI to Dr Milinda Antis.

## 2020-02-20 NOTE — Discharge Instructions (Signed)
You have been tested for COVID-19 today. °If your test returns positive, you will receive a phone call from [facility] regarding your results. °Negative test results are not called. °Both positive and negative results area always visible on MyChart. °If you do not have a MyChart account, sign up instructions are provided in your discharge papers. °Please do not hesitate to contact us should you have questions or concerns. ° °

## 2020-02-21 LAB — SARS CORONAVIRUS 2 (TAT 6-24 HRS): SARS Coronavirus 2: NEGATIVE

## 2020-05-13 ENCOUNTER — Ambulatory Visit
Admission: RE | Admit: 2020-05-13 | Discharge: 2020-05-13 | Disposition: A | Discharge status: Home / Self Care | Payer: 59 | Source: Ambulatory Visit | Attending: Family Medicine | Admitting: Family Medicine

## 2020-05-13 ENCOUNTER — Other Ambulatory Visit: Payer: Self-pay

## 2020-05-13 ENCOUNTER — Encounter: Payer: Self-pay | Admitting: *Deleted

## 2020-05-13 ENCOUNTER — Ambulatory Visit: Payer: 59 | Attending: Oncology | Admitting: *Deleted

## 2020-05-13 ENCOUNTER — Other Ambulatory Visit: Payer: Self-pay | Admitting: Family Medicine

## 2020-05-13 DIAGNOSIS — Z1231 Encounter for screening mammogram for malignant neoplasm of breast: Secondary | ICD-10-CM | POA: Diagnosis not present

## 2020-05-13 DIAGNOSIS — Z0000 Encounter for general adult medical examination without abnormal findings: Secondary | ICD-10-CM

## 2020-05-13 NOTE — Progress Notes (Signed)
Due to Covid 19 pandemic a televisit was used to enroll patient into our B[facility]CP program.  2 patient identifiers were used to confirm I was speaking to the correct patient.  Patient denies any breast problems.  Last mammogram on 04/16/18 was a birads 1.  Patient with a history of a hysterectomy in her late 69's for fibroids.  States she does not know her family history.  Screening mammogram to be completed.  Patient will be followed by her pcp Dr. Roxy Manns.

## 2020-05-18 ENCOUNTER — Encounter: Payer: Self-pay | Admitting: *Deleted

## 2020-05-18 NOTE — Progress Notes (Signed)
Letter mailed from [facility] to inform patient of her normal mammogram results.  Patient is to follow-up with annual screening in one year. 

## 2020-05-27 ENCOUNTER — Other Ambulatory Visit: Payer: Self-pay

## 2020-05-27 ENCOUNTER — Ambulatory Visit: Payer: 59 | Admitting: Family Medicine

## 2020-05-28 ENCOUNTER — Other Ambulatory Visit: Payer: Self-pay | Admitting: Cardiology

## 2020-05-28 DIAGNOSIS — I10 Essential (primary) hypertension: Secondary | ICD-10-CM

## 2020-06-03 ENCOUNTER — Encounter: Payer: Self-pay | Admitting: Family Medicine

## 2020-06-03 ENCOUNTER — Ambulatory Visit: Payer: 59 | Admitting: Family Medicine

## 2020-06-03 ENCOUNTER — Other Ambulatory Visit: Payer: Self-pay

## 2020-06-03 VITALS — BP 130/80 | HR 86 | Temp 97.2°F | Ht 63.0 in | Wt 167.2 lb

## 2020-06-03 DIAGNOSIS — G3184 Mild cognitive impairment, so stated: Secondary | ICD-10-CM | POA: Diagnosis not present

## 2020-06-03 DIAGNOSIS — N1831 Chronic kidney disease, stage 3a: Secondary | ICD-10-CM

## 2020-06-03 DIAGNOSIS — I10 Essential (primary) hypertension: Secondary | ICD-10-CM | POA: Diagnosis not present

## 2020-06-03 DIAGNOSIS — R7303 Prediabetes: Secondary | ICD-10-CM | POA: Diagnosis not present

## 2020-06-03 NOTE — Assessment & Plan Note (Signed)
Labs today  Still off hctz Working on a good water intake  Avoids nsaids bp is controlled

## 2020-06-03 NOTE — Patient Instructions (Addendum)
Labs today  Take care of yourself  Keep drinking lots of water   Great job with weight loss so far   Labs today for blood sugar and kidney function

## 2020-06-03 NOTE — Assessment & Plan Note (Signed)
This seems fairly stable today  Pt does repeat herself occasionally

## 2020-06-03 NOTE — Assessment & Plan Note (Signed)
Due for a1c Expect improved Good progress with diet/exercise and wt loss disc imp of low glycemic diet and wt loss to prevent DM2

## 2020-06-03 NOTE — Progress Notes (Signed)
Subjective:    Patient ID: Kristin Barton, female    DOB: 05-06-1955, 65 y.o.   MRN: [id number]  This visit occurred during the SARS-CoV-2 public health emergency.  Safety protocols were in place, including screening questions prior to the visit, additional usage of staff PPE, and extensive cleaning of exam room while observing appropriate contact time as indicated for disinfecting solutions.    HPI Pt presents for f/u of chronic health problems including prediabetes and HTN  Wt Readings from Last 3 Encounters:  06/03/20 167 lb 3 oz (75.8 kg)  11/27/19 180 lb 7 oz (81.8 kg)  08/27/19 187 lb 4 oz (84.9 kg)   29.62 kg/m Eating well and drinking plenty of water  Walks stairs for exercise at home   Feeling fine overall   HTN bp is stable today  No cp or palpitations or headaches or edema  No side effects to medicines  BP Readings from Last 3 Encounters:  06/03/20 130/80  02/20/20 (!) 143/95  11/27/19 139/81    Lisinopril 40 mg daily  Amlodipine 5 mg daily   Pulse Readings from Last 3 Encounters:  06/03/20 86  02/20/20 93  11/27/19 85     Lab Results  Component Value Date   CREATININE 1.24 (H) 08/27/2019   BUN 10 08/27/2019   NA 141 08/27/2019   K 3.4 (L) 08/27/2019   CL 104 08/27/2019   CO2 30 08/27/2019   Was prev on hctz and stopped it for renal labs /CKD  Prediabetes Lab Results  Component Value Date   HGBA1C 5.8 (A) 11/27/2019   Memory is stable -in setting of MCI  Some improvement with better health habits and self care 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 Prediabetes 08/07/2019  . Mild cognitive impairment with memory loss 08/14/2018  . Headache disorder 08/14/2018  . Stress reaction 03/13/2018  . Headache 03/13/2018  . CKD (chronic kidney disease), stage III (HCC) 03/09/2018  . Right knee  pain 03/22/2017  . Lumbar pain 03/22/2017  . Encounter for screening mammogram for breast cancer 12/29/2016  . Screening for HIV (human immunodeficiency virus) 01/11/2016  . Elevated serum creatinine 06/17/2014  . H/O syncope 11/22/2013  . Special screening for malignant neoplasms, colon 08/23/2010  . Routine general medical examination at a health care facility 08/17/2010  . HYPERTENSION, BENIGN ESSENTIAL 01/22/2009   Past Medical History:  Diagnosis Date  . Abnormal urinalysis 06/18/2014  . Elevated serum creatinine 06/17/2014   With GFR of 53   . Hypertension   . Hypokalemia   . Inflamed skin tag 06/14/2013  . Need for hepatitis C screening test 01/11/2016  . Obesity 07/01/2014   Past Surgical History:  Procedure Laterality Date  . CESAREAN SECTION  1987  . PARTIAL HYSTERECTOMY     for cyst, has one ovary remaining (no hx of abn pap smears)  . TUBAL LIGATION     Social History   Tobacco Use  . Smoking status: Never Smoker  . Smokeless tobacco: Never Used  Substance Use Topics  . Alcohol use: No    Alcohol/week: 0.0 standard drinks  . Drug use: No   Family History  Problem Relation Age of Onset  . Hypertension Mother   . Asthma Father   . COPD Father        cigars  .  Lung cancer Father   . Breast cancer Sister   . Diabetes Sister        mild  . Breast cancer Sister   . Other Other        ? heart problems- distant family  . Mental illness Other        ? in family   No Known Allergies Current Outpatient Medications on File Prior to Visit  Medication Sig Dispense Refill  . amLODipine (NORVASC) 5 MG tablet Take 1 tablet (5 mg total) by mouth daily. 90 tablet 3  . lisinopril (ZESTRIL) 40 MG tablet Take 1 tablet (40 mg total) by mouth daily. 90 tablet 3   No current facility-administered medications on file prior to visit.    Review of Systems  Constitutional: Negative for activity change, appetite change, fatigue, fever and unexpected weight change.  HENT:  Negative for congestion, ear pain, rhinorrhea, sinus pressure and sore throat.   Eyes: Negative for pain, redness and visual disturbance.  Respiratory: Negative for cough, shortness of breath and wheezing.   Cardiovascular: Negative for chest pain and palpitations.  Gastrointestinal: Negative for abdominal pain, blood in stool, constipation and diarrhea.  Endocrine: Negative for polydipsia and polyuria.  Genitourinary: Negative for dysuria, frequency and urgency.  Musculoskeletal: Negative for arthralgias, back pain and myalgias.  Skin: Negative for pallor and rash.  Allergic/Immunologic: Negative for environmental allergies.  Neurological: Negative for dizziness, syncope and headaches.  Hematological: Negative for adenopathy. Does not bruise/bleed easily.  Psychiatric/Behavioral: Negative for decreased concentration and dysphoric mood. The patient is not nervous/anxious.        Objective:   Physical Exam Constitutional:      General: She is not in acute distress.    Appearance: Normal appearance. She is well-developed. She is not ill-appearing.     Comments: Overweight  Wt loss noted  HENT:     Head: Normocephalic and atraumatic.  Eyes:     General: No scleral icterus.    Conjunctiva/sclera: Conjunctivae normal.     Pupils: Pupils are equal, round, and reactive to light.  Neck:     Thyroid: No thyromegaly.     Vascular: No carotid bruit or JVD.  Cardiovascular:     Rate and Rhythm: Normal rate and regular rhythm.     Heart sounds: Normal heart sounds. No gallop.   Pulmonary:     Effort: Pulmonary effort is normal. No respiratory distress.     Breath sounds: Normal breath sounds. No wheezing or rales.  Abdominal:     General: Bowel sounds are normal. There is no distension or abdominal bruit.     Palpations: Abdomen is soft. There is no mass.     Tenderness: There is no abdominal tenderness.  Musculoskeletal:     Cervical back: Normal range of motion and neck supple. No  tenderness.     Right lower leg: No edema.     Left lower leg: No edema.  Lymphadenopathy:     Cervical: No cervical adenopathy.  Skin:    General: Skin is warm and dry.     Coloration: Skin is not pale.     Findings: No erythema or rash.  Neurological:     Mental Status: She is alert.     Cranial Nerves: No cranial nerve deficit.     Sensory: No sensory deficit.     Deep Tendon Reflexes: Reflexes are normal and symmetric. Reflexes normal.  Psychiatric:        Mood and Affect: Mood normal.  Comments: Pleasant  Not confused Repeats herself occasionally  Good mood           Assessment & Plan:   Problem List Items Addressed This Visit      Cardiovascular and Mediastinum   HYPERTENSION, BENIGN ESSENTIAL - Primary    bp in fair control at this time  BP Readings from Last 1 Encounters:  06/03/20 130/80   No changes needed Most recent labs reviewed  Disc lifstyle change with low sodium diet and exercise  Plan to continue lisinopril 40 mg daily and amlodipine 5 mg daily  Wt loss has also helped      Relevant Orders   Basic metabolic panel     Nervous and Auditory   Mild cognitive impairment with memory loss    This seems fairly stable today  Pt does repeat herself occasionally        Genitourinary   CKD (chronic kidney disease), stage III (HCC)    Labs today  Still off hctz Working on a good water intake  Avoids nsaids bp is controlled      Relevant Orders   Hemoglobin A1c     Other   Prediabetes    Due for a1c Expect improved Good progress with diet/exercise and wt loss disc imp of low glycemic diet and wt loss to prevent DM2        Relevant Orders   Hemoglobin A1c

## 2020-06-03 NOTE — Assessment & Plan Note (Signed)
bp in fair control at this time  BP Readings from Last 1 Encounters:  06/03/20 130/80   No changes needed Most recent labs reviewed  Disc lifstyle change with low sodium diet and exercise  Plan to continue lisinopril 40 mg daily and amlodipine 5 mg daily  Wt loss has also helped

## 2020-06-04 LAB — BASIC METABOLIC PANEL
BUN: 13 mg/dL (ref 6–23)
CO2: 30 mEq/L (ref 19–32)
Calcium: 9.9 mg/dL (ref 8.4–10.5)
Chloride: 105 mEq/L (ref 96–112)
Creatinine, Ser: 1.16 mg/dL (ref 0.40–1.20)
GFR: 49.76 mL/min — ABNORMAL LOW (ref >60.00)
Glucose, Bld: 92 mg/dL (ref 70–99)
Potassium: 3.7 mEq/L (ref 3.5–5.1)
Sodium: 142 mEq/L (ref 135–145)

## 2020-06-04 LAB — HEMOGLOBIN A1C: Hgb A1c MFr Bld: 6 % (ref 4.6–6.5)

## 2020-12-02 ENCOUNTER — Encounter: Payer: Self-pay | Admitting: Family Medicine

## 2020-12-02 ENCOUNTER — Other Ambulatory Visit: Payer: Self-pay

## 2020-12-02 ENCOUNTER — Ambulatory Visit (INDEPENDENT_AMBULATORY_CARE_PROVIDER_SITE_OTHER): Payer: Medicare HMO | Admitting: Family Medicine

## 2020-12-02 VITALS — BP 138/88 | HR 74 | Temp 97.9°F | Ht 63.0 in | Wt 161.2 lb

## 2020-12-02 DIAGNOSIS — G3184 Mild cognitive impairment, so stated: Secondary | ICD-10-CM

## 2020-12-02 DIAGNOSIS — R7303 Prediabetes: Secondary | ICD-10-CM

## 2020-12-02 DIAGNOSIS — I10 Essential (primary) hypertension: Secondary | ICD-10-CM

## 2020-12-02 DIAGNOSIS — Z0000 Encounter for general adult medical examination without abnormal findings: Secondary | ICD-10-CM

## 2020-12-02 DIAGNOSIS — N1831 Chronic kidney disease, stage 3a: Secondary | ICD-10-CM

## 2020-12-02 DIAGNOSIS — Z1211 Encounter for screening for malignant neoplasm of colon: Secondary | ICD-10-CM | POA: Diagnosis not present

## 2020-12-02 MED ORDER — AMLODIPINE BESYLATE 5 MG PO TABS: 5.0000 mg | ORAL_TABLET | Freq: Every day | ORAL | 3 refills | Status: DC

## 2020-12-02 MED ORDER — LISINOPRIL 40 MG PO TABS: 40.0000 mg | ORAL_TABLET | Freq: Every day | ORAL | 3 refills | Status: DC

## 2020-12-02 NOTE — Assessment & Plan Note (Signed)
This improved after stopping hctz  Labs ordered today  Encouraged her to keep up a good water intake

## 2020-12-02 NOTE — Assessment & Plan Note (Signed)
bp in fair control at this time  BP Readings from Last 1 Encounters:  12/02/20 138/88   No changes needed Most recent labs reviewed  Disc lifstyle change with low sodium diet and exercise  Plan to continue Lisinopril 40 mg daily  Amlodipine 5 mg daily

## 2020-12-02 NOTE — Progress Notes (Signed)
Subjective:    Patient ID: Kristin Barton, female    DOB: December 11, 1955, 65 y.o.   MRN: [id number]  This visit occurred during the SARS-CoV-2 public health emergency.  Safety protocols were in place, including screening questions prior to the visit, additional usage of staff PPE, and extensive cleaning of exam room while observing appropriate contact time as indicated for disinfecting solutions.   HPI Pt presents for welcome to medicare visit   I have personally reviewed the Medicare Annual Wellness questionnaire and have noted 1. The patient's medical and social history 2. Their use of alcohol, tobacco or illicit drugs 3. Their current medications and supplements 4. The patient's functional ability including ADL's, fall risks, home safety risks and hearing or visual             impairment. 5. Diet and physical activities 6. Evidence for depression or mood disorders  The patients weight, height, BMI have been recorded in the chart and visual acuity is per eye clinic.  I have made referrals, counseling and provided education to the patient based review of the above and I have provided the pt with a written personalized care plan for preventive services. Reviewed and updated provider list, see scanned forms.  See scanned forms.  Routine anticipatory guidance given to patient.  See health maintenance. Colon cancer screening colonoscopy 12/15 Breast cancer screening mammogram 4/22  Sister had breast cancer  Self breast exam: no lumps  Flu vaccine declines Tetanus vaccine Tdap 7/12, declines  Pneumovax declines  Zoster vaccine declines  Covid vaccines declines  Dexa: declines for now , may consider later  Falls: none Fractures:none Supplements-none, is open to gummy supplements  Exercise : does a program in her house  Walks the stairs in a routine  Does not like floor exercises but does them  Uses a chair also  Not doing a lot lately  Stays in the house   Advance  directive Cognitive function addressed- see scanned forms- and if abnormal then additional documentation follows.   Has h/o MCI  Does not think she is worse, in fact better than she was  Had trouble during stress in the past, but this is better now with better mood   She remembers dates and names that the rest of the family forgets  She remembers things in the past also   Sleeps well  Does not misplace things    PMH and SH reviewed  Meds, vitals, and allergies reviewed.   ROS: See HPI.  Otherwise negative.    Weight : Wt Readings from Last 3 Encounters:  12/02/20 161 lb 4 oz (73.1 kg)  06/03/20 167 lb 3 oz (75.8 kg)  11/27/19 180 lb 7 oz (81.8 kg)   28.56 kg/m She does exercise more  Eating healthy -avoids junk food  Gets her sweet from fruits  Bananas -loves   Eating more vegetables now (daughter makes her eat broccoli) Loves salads    Hearing/vision: Hearing Screening   500Hz  1000Hz  2000Hz  4000Hz   Right ear 0 40 40 40  Left ear 0 40 40 0   She does not complain about hearing  She does not have to turn TV up    PHQ: Depression screen [facility] 2/9 12/02/2020 12/02/2020 08/07/2019  Decreased Interest 0 0 0  Down, Depressed, Hopeless 0 0 0  PHQ - 2 Score 0 0 0    Mood is good  Not down or hopeless  Not anxious or nervous    ADLs: can do all  Functionality:o problems   Care team : Ryzen Deady-pcp Sankar-gen surg [facility]- cardiology  HTN bp is stable today  No cp or palpitations or headaches or edema  No side effects to medicines  BP Readings from Last 3 Encounters:  12/02/20 138/88  06/03/20 130/80  02/20/20 (!) 143/95    Taking lisinopril 40 mg daily  Amlodipine 5 mg daily   H/o CKD 3 Lab Results  Component Value Date   CREATININE 1.16 06/03/2020   BUN 13 06/03/2020   NA 142 06/03/2020   K 3.7 06/03/2020   CL 105 06/03/2020   CO2 30 06/03/2020   Due for labs  No longer on hctz   Prediabetes Lab Results  Component Value Date   HGBA1C 6.0  06/03/2020   Patient Active Problem List   Diagnosis Date Noted   Welcome to Medicare preventive visit 12/02/2020   Prediabetes 08/07/2019   Mild cognitive impairment with memory loss 08/14/2018   Headache disorder 08/14/2018   Stress reaction 03/13/2018   CKD (chronic kidney disease), stage III ([place]) 03/09/2018   Right knee pain 03/22/2017   Lumbar pain 03/22/2017   Encounter for screening mammogram for breast cancer 12/29/2016   Screening for HIV (human immunodeficiency virus) 01/11/2016   Elevated serum creatinine 06/17/2014   H/O syncope 11/22/2013   Special screening for malignant neoplasms, colon 08/23/2010   Routine general medical examination at a health care facility 08/17/2010   HYPERTENSION, BENIGN ESSENTIAL 01/22/2009   Past Medical History:  Diagnosis Date   Abnormal urinalysis 06/18/2014   Elevated serum creatinine 06/17/2014   With GFR of 53    Hypertension    Hypokalemia    Inflamed skin tag 06/14/2013   Need for hepatitis C screening test 01/11/2016   Obesity 07/01/2014   Past Surgical History:  Procedure Laterality Date   CESAREAN [place]     for cyst, has one ovary remaining (no hx of abn pap smears)   TUBAL LIGATION     Social History   Tobacco Use   Smoking status: Never   Smokeless tobacco: Never  Substance Use Topics   Alcohol use: No    Alcohol/week: 0.0 standard drinks   Drug use: No   Family History  Problem Relation Age of Onset   Hypertension Mother    Asthma Father    COPD Father        cigars   Lung cancer Father    Breast cancer Sister    Diabetes Sister        mild   Breast cancer Sister    Other Other        ? heart problems- distant family   Mental illness Other        ? in family   No Known Allergies No current outpatient medications on file prior to visit.   No current facility-administered medications on file prior to visit.    Review of Systems  Constitutional:  Negative for activity  change, appetite change, fatigue, fever and unexpected weight change.  HENT:  Negative for congestion, ear pain, rhinorrhea, sinus pressure and sore throat.   Eyes:  Negative for pain, redness and visual disturbance.  Respiratory:  Negative for cough, shortness of breath and wheezing.   Cardiovascular:  Negative for chest pain and palpitations.  Gastrointestinal:  Negative for abdominal pain, blood in stool, constipation and diarrhea.  Endocrine: Negative for polydipsia and polyuria.  Genitourinary:  Negative for dysuria, frequency and urgency.  Musculoskeletal:  Negative for arthralgias, back pain and myalgias.  Skin:  Negative for pallor and rash.  Allergic/Immunologic: Negative for environmental allergies.  Neurological:  Negative for dizziness, syncope and headaches.  Hematological:  Negative for adenopathy. Does not bruise/bleed easily.  Psychiatric/Behavioral:  Negative for decreased concentration and dysphoric mood. The patient is not nervous/anxious.       Objective:   Physical Exam Constitutional:      General: She is not in acute distress.    Appearance: Normal appearance. She is well-developed and normal weight. She is not ill-appearing or diaphoretic.  HENT:     Head: Normocephalic and atraumatic.     Right Ear: Tympanic membrane, ear canal and external ear normal.     Left Ear: Tympanic membrane, ear canal and external ear normal.     Nose: Nose normal. No congestion.     Mouth/Throat:     Mouth: Mucous membranes are moist.     Pharynx: Oropharynx is clear. No posterior oropharyngeal erythema.  Eyes:     General: No scleral icterus.    Extraocular Movements: Extraocular movements intact.     Conjunctiva/sclera: Conjunctivae normal.     Pupils: Pupils are equal, round, and reactive to light.  Neck:     Thyroid: No thyromegaly.     Vascular: No carotid bruit or JVD.  Cardiovascular:     Rate and Rhythm: Normal rate and regular rhythm.     Pulses: Normal pulses.      Heart sounds: Normal heart sounds.    No gallop.  Pulmonary:     Effort: Pulmonary effort is normal. No respiratory distress.     Breath sounds: Normal breath sounds. No wheezing.     Comments: Good air exch Chest:     Chest wall: No tenderness.  Abdominal:     General: Bowel sounds are normal. There is no distension or abdominal bruit.     Palpations: Abdomen is soft. There is no mass.     Tenderness: There is no abdominal tenderness.     Hernia: No hernia is present.  Genitourinary:    Comments: Breast exam: No mass, nodules, thickening, tenderness, bulging, retraction, inflamation, nipple discharge or skin changes noted.  No axillary or clavicular LA.     Musculoskeletal:        General: No tenderness. Normal range of motion.     Cervical back: Normal range of motion and neck supple. No rigidity. No muscular tenderness.     Right lower leg: No edema.     Left lower leg: No edema.  Lymphadenopathy:     Cervical: No cervical adenopathy.  Skin:    General: Skin is warm and dry.     Coloration: Skin is not pale.     Findings: No erythema or rash.     Comments: Few skin tags and lentigines  Neurological:     Mental Status: She is alert. Mental status is at baseline.     Cranial Nerves: No cranial nerve deficit.     Motor: No abnormal muscle tone.     Coordination: Coordination normal.     Gait: Gait normal.     Deep Tendon Reflexes: Reflexes are normal and symmetric. Reflexes normal.  Psychiatric:        Mood and Affect: Mood normal.        Speech: Speech normal.     Comments: Hearing loss noted  Cognitively stable           Assessment & Plan:   Problem  List Items Addressed This Visit       Cardiovascular and Mediastinum   HYPERTENSION, BENIGN ESSENTIAL    bp in fair control at this time  BP Readings from Last 1 Encounters:  12/02/20 138/88  No changes needed Most recent labs reviewed  Disc lifstyle change with low sodium diet and exercise  Plan to  continue Lisinopril 40 mg daily  Amlodipine 5 mg daily        Relevant Medications   lisinopril (ZESTRIL) 40 MG tablet   amLODipine (NORVASC) 5 MG tablet   Other Relevant Orders   CBC with Differential/Platelet   Comprehensive metabolic panel   Lipid panel   TSH     Nervous and Auditory   Mild cognitive impairment with memory loss    This was more noticeable when she had fairly severe anxiety  Improved not  She thinks she is back to baseline  Has some paranoia about leaving the house/exposing herself to covid  No agitation  Did not repeat herself today        Genitourinary   CKD (chronic kidney disease), stage III ([place])    This improved after stopping hctz  Labs ordered today  Encouraged her to keep up a good water intake        Other   Special screening for malignant neoplasms, colon    Colonoscopy is utd from 2015      Prediabetes    Still watches her diet fairly carefully  Chooses fruit instead of sweets and other carbs a1c ordered disc imp of low glycemic diet and wt loss to prevent DM2        Relevant Orders   Hemoglobin A1c   Welcome to Medicare preventive visit - Primary    Reviewed health habits including diet and exercise and skin cancer prevention Reviewed appropriate screening tests for age  Also reviewed health mt list, fam hx and immunization status , as well as social and family history   See HPI Labs ordered Colonoscopy and mammogram utd  Has had a hysterectomy  Declines all immunizations (counseled) Declines dexa  No falls or fractures, given info on OP prevention Discussed fall prevention and fitness Does not want to work on advance directive at this time Cognitively doing better (remote hx of MCI when anxiety was high) Mood is good as well, PHQ score of 0 Reviewed hearing screen, declines audiology eval No problems with ADLS or functional status        Other Visit Diagnoses     Essential hypertension       Relevant  Medications   lisinopril (ZESTRIL) 40 MG tablet   amLODipine (NORVASC) 5 MG tablet

## 2020-12-02 NOTE — Assessment & Plan Note (Signed)
Still watches her diet fairly carefully  Chooses fruit instead of sweets and other carbs a1c ordered disc imp of low glycemic diet and wt loss to prevent DM2

## 2020-12-02 NOTE — Assessment & Plan Note (Signed)
Reviewed health habits including diet and exercise and skin cancer prevention Reviewed appropriate screening tests for age  Also reviewed health mt list, fam hx and immunization status , as well as social and family history   See HPI Labs ordered Colonoscopy and mammogram utd  Has had a hysterectomy  Declines all immunizations (counseled) Declines dexa  No falls or fractures, given info on OP prevention Discussed fall prevention and fitness Does not want to work on advance directive at this time Cognitively doing better (remote hx of MCI when anxiety was high) Mood is good as well, PHQ score of 0 Reviewed hearing screen, declines audiology eval No problems with ADLS or functional status

## 2020-12-02 NOTE — Assessment & Plan Note (Signed)
This was more noticeable when she had fairly severe anxiety  Improved not  She thinks she is back to baseline  Has some paranoia about leaving the house/exposing herself to covid  No agitation  Did not repeat herself today

## 2020-12-02 NOTE — Assessment & Plan Note (Signed)
Colonoscopy is utd from 2015

## 2020-12-02 NOTE — Patient Instructions (Addendum)
Try to get 1200-1500 mg of calcium per day with at least 1000 iu of vitamin D - for bone health (to prevent osteoporosis) Gummies are ok   If you want to see a hearing specialist let us know  Keep exercising  Keep drinking water   Labs today   Avoid added sugar in foods Try to get most of your carbohydrates from produce (with the exception of white potatoes)  Eat less bread/pasta/rice/snack foods/cereals/sweets and other items from the middle of the grocery store (processed carbs)

## 2020-12-03 LAB — COMPREHENSIVE METABOLIC PANEL
ALT: 12 U/L (ref 0–35)
AST: 19 U/L (ref 0–37)
Albumin: 4.3 g/dL (ref 3.5–5.2)
Alkaline Phosphatase: 68 U/L (ref 39–117)
BUN: 10 mg/dL (ref 6–23)
CO2: 31 mEq/L (ref 19–32)
Calcium: 9.5 mg/dL (ref 8.4–10.5)
Chloride: 103 mEq/L (ref 96–112)
Creatinine, Ser: 1.12 mg/dL (ref 0.40–1.20)
GFR: 51.72 mL/min — ABNORMAL LOW (ref >60.00)
Glucose, Bld: 97 mg/dL (ref 70–99)
Potassium: 4 mEq/L (ref 3.5–5.1)
Sodium: 141 mEq/L (ref 135–145)
Total Bilirubin: 0.4 mg/dL (ref 0.2–1.2)
Total Protein: 7 g/dL (ref 6.0–8.3)

## 2020-12-03 LAB — CBC WITH DIFFERENTIAL/PLATELET
Basophils Absolute: 0.1 10*3/uL (ref 0.0–0.1)
Basophils Relative: 1 % (ref 0.0–3.0)
Eosinophils Absolute: 0.1 10*3/uL (ref 0.0–0.7)
Eosinophils Relative: 1.7 % (ref 0.0–5.0)
HCT: 40.1 % (ref 36.0–46.0)
Hemoglobin: 13.1 g/dL (ref 12.0–15.0)
Lymphocytes Relative: 31.9 % (ref 12.0–46.0)
Lymphs Abs: 1.7 10*3/uL (ref 0.7–4.0)
MCHC: 32.7 g/dL (ref 30.0–36.0)
MCV: 91.8 fl (ref 78.0–100.0)
Monocytes Absolute: 0.4 10*3/uL (ref 0.1–1.0)
Monocytes Relative: 7.9 % (ref 3.0–12.0)
Neutro Abs: 3.1 10*3/uL (ref 1.4–7.7)
Neutrophils Relative %: 57.5 % (ref 43.0–77.0)
Platelets: 225 10*3/uL (ref 150.0–400.0)
RBC: 4.37 Mil/uL (ref 3.87–5.11)
RDW: 14.2 % (ref 11.5–15.5)
WBC: 5.5 10*3/uL (ref 4.0–10.5)

## 2020-12-03 LAB — LIPID PANEL
Cholesterol: 202 mg/dL — ABNORMAL HIGH (ref 0–200)
HDL: 66.8 mg/dL (ref >39.00)
LDL Cholesterol: 116 mg/dL — ABNORMAL HIGH (ref 0–99)
NonHDL: 135.05
Total CHOL/HDL Ratio: 3
Triglycerides: 93 mg/dL (ref 0.0–149.0)
VLDL: 18.6 mg/dL (ref 0.0–40.0)

## 2020-12-03 LAB — TSH: TSH: 2.44 u[IU]/mL (ref 0.35–5.50)

## 2020-12-03 LAB — HEMOGLOBIN A1C: Hgb A1c MFr Bld: 5.9 % (ref 4.6–6.5)

## 2020-12-04 ENCOUNTER — Encounter: Payer: Self-pay | Admitting: *Deleted

## 2021-04-19 ENCOUNTER — Other Ambulatory Visit: Payer: Self-pay | Admitting: Family Medicine

## 2021-04-19 DIAGNOSIS — Z1231 Encounter for screening mammogram for malignant neoplasm of breast: Secondary | ICD-10-CM

## 2021-05-26 ENCOUNTER — Ambulatory Visit
Admission: RE | Admit: 2021-05-26 | Discharge: 2021-05-26 | Disposition: A | Discharge status: Home / Self Care | Payer: Medicare HMO | Source: Ambulatory Visit | Attending: Family Medicine | Admitting: Family Medicine

## 2021-05-26 DIAGNOSIS — Z1231 Encounter for screening mammogram for malignant neoplasm of breast: Secondary | ICD-10-CM | POA: Diagnosis not present

## 2021-06-25 DIAGNOSIS — H6982 Other specified disorders of Eustachian tube, left ear: Secondary | ICD-10-CM | POA: Diagnosis not present

## 2021-06-25 DIAGNOSIS — H6123 Impacted cerumen, bilateral: Secondary | ICD-10-CM | POA: Diagnosis not present

## 2021-06-25 DIAGNOSIS — Z570 Occupational exposure to noise: Secondary | ICD-10-CM | POA: Diagnosis not present

## 2021-08-19 DIAGNOSIS — H903 Sensorineural hearing loss, bilateral: Secondary | ICD-10-CM | POA: Diagnosis not present

## 2021-12-01 ENCOUNTER — Telehealth: Payer: Self-pay | Admitting: Family Medicine

## 2021-12-01 NOTE — Telephone Encounter (Signed)
Left message for patient to schedule Annual Wellness Visit.  Please schedule with Nurse Health Advisor Denisa O'Brien-Blaney, LPN  This appt can be telephone visit.  Please call [phone] ask for Kathy 

## 2021-12-03 ENCOUNTER — Ambulatory Visit (INDEPENDENT_AMBULATORY_CARE_PROVIDER_SITE_OTHER): Payer: Medicare HMO

## 2021-12-03 VITALS — Ht 63.0 in | Wt 161.0 lb

## 2021-12-03 DIAGNOSIS — Z0000 Encounter for general adult medical examination without abnormal findings: Secondary | ICD-10-CM

## 2021-12-03 NOTE — Patient Instructions (Addendum)
Ms. Kristin Barton , Thank you for taking time to come for your Medicare Wellness Visit. I appreciate your ongoing commitment to your health goals. Please review the following plan we discussed and let me know if I can assist you in the future.   These are the goals we discussed:  Goals      Maintain healthy lifestyle     Stay active Healthy diet        This is a list of the screening recommended for you and due dates:  Health Maintenance  Topic Date Due   COVID-19 Vaccine (1) 12/19/2021*   Pneumonia Vaccine (1 - PCV) 01/31/2022*   Tetanus Vaccine  01/31/2022*   DEXA scan (bone density measurement)  03/03/2022*   Zoster (Shingles) Vaccine (1 of 2) 03/05/2022*   Flu Shot  05/01/2022*   Mammogram  05/27/2022   Medicare Annual Wellness Visit  12/04/2022   Colon Cancer Screening  01/23/2024   Hepatitis C Screening: USPSTF Recommendation to screen - Ages 18-79 yo.  Completed   HPV Vaccine  Aged Out  *Topic was postponed. The date shown is not the original due date.    Advanced directives: End of life planning; Advance aging; Advanced directives discussed.  Copy of current HCPOA/Living Will requested upon completion.  Conditions/risks identified: none new  Next appointment: Follow up in one year for your annual wellness visit    Preventive Care 65 Years and Older, Female Preventive care refers to lifestyle choices and visits with your health care provider that can promote health and wellness. What does preventive care include? A yearly physical exam. This is also called an annual well check. Dental exams once or twice a year. Routine eye exams. Ask your health care provider how often you should have your eyes checked. Personal lifestyle choices, including: Daily care of your teeth and gums. Regular physical activity. Eating a healthy diet. Avoiding tobacco and drug use. Limiting alcohol use. Practicing safe sex. Taking low-dose aspirin every day. Taking vitamin and mineral  supplements as recommended by your health care provider. What happens during an annual well check? The services and screenings done by your health care provider during your annual well check will depend on your age, overall health, lifestyle risk factors, and family history of disease. Counseling  Your health care provider may ask you questions about your: Alcohol use. Tobacco use. Drug use. Emotional well-being. Home and relationship well-being. Sexual activity. Eating habits. History of falls. Memory and ability to understand (cognition). Work and work Statistician. Reproductive health. Screening  You may have the following tests or measurements: Height, weight, and BMI. Blood pressure. Lipid and cholesterol levels. These may be checked every 5 years, or more frequently if you are over 5 years old. Skin check. Lung cancer screening. You may have this screening every year starting at age 20 if you have a 30-pack-year history of smoking and currently smoke or have quit within the past 15 years. Fecal occult blood test (FOBT) of the stool. You may have this test every year starting at age 95. Flexible sigmoidoscopy or colonoscopy. You may have a sigmoidoscopy every 5 years or a colonoscopy every 10 years starting at age 43. Hepatitis C blood test. Hepatitis B blood test. Sexually transmitted disease (STD) testing. Diabetes screening. This is done by checking your blood sugar (glucose) after you have not eaten for a while (fasting). You may have this done every 1-3 years. Bone density scan. This is done to screen for osteoporosis. You may have this  done starting at age 31. Mammogram. This may be done every 1-2 years. Talk to your health care provider about how often you should have regular mammograms. Talk with your health care provider about your test results, treatment options, and if necessary, the need for more tests. Vaccines  Your health care provider may recommend certain  vaccines, such as: Influenza vaccine. This is recommended every year. Tetanus, diphtheria, and acellular pertussis (Tdap, Td) vaccine. You may need a Td booster every 10 years. Zoster vaccine. You may need this after age 49. Pneumococcal 13-valent conjugate (PCV13) vaccine. One dose is recommended after age 75. Pneumococcal polysaccharide (PPSV23) vaccine. One dose is recommended after age 57. Talk to your health care provider about which screenings and vaccines you need and how often you need them. This information is not intended to replace advice given to you by your health care provider. Make sure you discuss any questions you have with your health care provider. Document Released: 02/13/2015 Document Revised: 10/07/2015 Document Reviewed: 11/18/2014 Elsevier Interactive Patient Education  2017 [place] Prevention in the Home Falls can cause injuries. They can happen to people of all ages. There are many things you can do to make your home safe and to help prevent falls. What can I do on the outside of my home? Regularly fix the edges of walkways and driveways and fix any cracks. Remove anything that might make you trip as you walk through a door, such as a raised step or threshold. Trim any bushes or trees on the path to your home. Use bright outdoor lighting. Clear any walking paths of anything that might make someone trip, such as rocks or tools. Regularly check to see if handrails are loose or broken. Make sure that both sides of any steps have handrails. Any raised decks and porches should have guardrails on the edges. Have any leaves, snow, or ice cleared regularly. Use sand or salt on walking paths during winter. Clean up any spills in your garage right away. This includes oil or grease spills. What can I do in the bathroom? Use night lights. Install grab bars by the toilet and in the tub and shower. Do not use towel bars as grab bars. Use non-skid mats or decals in  the tub or shower. If you need to sit down in the shower, use a plastic, non-slip stool. Keep the floor dry. Clean up any water that spills on the floor as soon as it happens. Remove soap buildup in the tub or shower regularly. Attach bath mats securely with double-sided non-slip rug tape. Do not have throw rugs and other things on the floor that can make you trip. What can I do in the bedroom? Use night lights. Make sure that you have a light by your bed that is easy to reach. Do not use any sheets or blankets that are too big for your bed. They should not hang down onto the floor. Have a firm chair that has side arms. You can use this for support while you get dressed. Do not have throw rugs and other things on the floor that can make you trip. What can I do in the kitchen? Clean up any spills right away. Avoid walking on wet floors. Keep items that you use a lot in easy-to-reach places. If you need to reach something above you, use a strong step stool that has a grab bar. Keep electrical cords out of the way. Do not use floor polish or wax  that makes floors slippery. If you must use wax, use non-skid floor wax. Do not have throw rugs and other things on the floor that can make you trip. What can I do with my stairs? Do not leave any items on the stairs. Make sure that there are handrails on both sides of the stairs and use them. Fix handrails that are broken or loose. Make sure that handrails are as long as the stairways. Check any carpeting to make sure that it is firmly attached to the stairs. Fix any carpet that is loose or worn. Avoid having throw rugs at the top or bottom of the stairs. If you do have throw rugs, attach them to the floor with carpet tape. Make sure that you have a light switch at the top of the stairs and the bottom of the stairs. If you do not have them, ask someone to add them for you. What else can I do to help prevent falls? Wear shoes that: Do not have high  heels. Have rubber bottoms. Are comfortable and fit you well. Are closed at the toe. Do not wear sandals. If you use a stepladder: Make sure that it is fully opened. Do not climb a closed stepladder. Make sure that both sides of the stepladder are locked into place. Ask someone to hold it for you, if possible. Clearly mark and make sure that you can see: Any grab bars or handrails. First and last steps. Where the edge of each step is. Use tools that help you move around (mobility aids) if they are needed. These include: Canes. Walkers. Scooters. Crutches. Turn on the lights when you go into a dark area. Replace any light bulbs as soon as they burn out. Set up your furniture so you have a clear path. Avoid moving your furniture around. If any of your floors are uneven, fix them. If there are any pets around you, be aware of where they are. Review your medicines with your doctor. Some medicines can make you feel dizzy. This can increase your chance of falling. Ask your doctor what other things that you can do to help prevent falls. This information is not intended to replace advice given to you by your health care provider. Make sure you discuss any questions you have with your health care provider. Document Released: 11/13/2008 Document Revised: 06/25/2015 Document Reviewed: 02/21/2014 Elsevier Interactive Patient Education  2017 [employer].

## 2021-12-03 NOTE — Progress Notes (Signed)
Subjective:   Kristin Barton is a 66 y.o. female who presents for an Initial Medicare Annual Wellness Visit.  Review of Systems    No ROS.  Medicare Wellness Virtual Visit.  Visual/audio telehealth visit, UTA vital signs.   See social history for additional risk factors.   Cardiac Risk Factors include: advanced age (>68men, >8 women)     Objective:    Today's Vitals   12/03/21 1540  Weight: 161 lb (73 kg)  Height: 5\' 3"  (1.6 m)   Body mass index is 28.52 kg/m.     12/03/2021    4:26 PM 03/09/2018    5:00 AM 03/09/2018   12:09 AM 03/19/2017    3:08 PM  Advanced Directives  Does Patient Have a Medical Advance Directive? No No No No  Would patient like information on creating a medical advance directive? No - Patient declined No - Patient declined  No - Patient declined    Current Medications (verified) Outpatient Encounter Medications as of 12/03/2021  Medication Sig   amLODipine (NORVASC) 5 MG tablet Take 1 tablet (5 mg total) by mouth daily.   lisinopril (ZESTRIL) 40 MG tablet Take 1 tablet (40 mg total) by mouth daily.   No facility-administered encounter medications on file as of 12/03/2021.    Allergies (verified) Patient has no known allergies.   History: Past Medical History:  Diagnosis Date   Abnormal urinalysis 06/18/2014   Elevated serum creatinine 06/17/2014   With GFR of 53    Hypertension    Hypokalemia    Inflamed skin tag 06/14/2013   Need for hepatitis C screening test 01/11/2016   Obesity 07/01/2014   Past Surgical History:  Procedure Laterality Date   CESAREAN SECTION  1987   PARTIAL HYSTERECTOMY     for cyst, has one ovary remaining (no hx of abn pap smears)   TUBAL LIGATION     Family History  Problem Relation Age of Onset   Hypertension Mother    Asthma Father    COPD Father        cigars   Lung cancer Father    Breast cancer Sister    Diabetes Sister        mild   Breast cancer Sister    Other Other        ? heart problems-  distant family   Mental illness Other        ? 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  Tobacco Use   Smoking status: Never   Smokeless tobacco: Never  Substance and Sexual Activity   Alcohol use: No    Alcohol/week: 0.0 standard drinks of alcohol   Drug use: No   Sexual activity: Not on file  Other Topics Concern   Not on file  Social History Narrative   Regular exercise: walking   Cares for new grandchild         Social Determinants of Health   Financial Resource Strain: Low Risk  (12/03/2021)   Overall Financial Resource Strain (CARDIA)    Difficulty of Paying Living Expenses: Not hard at all  Food Insecurity: No Food Insecurity (12/03/2021)   Hunger Vital Sign  Worried About Programme researcher, broadcasting/film/video in the Last Year: Never true    Ran Out of Food in the Last Year: Never true  Transportation Needs: No Transportation Needs (12/03/2021)   PRAPARE - Administrator, Civil Service (Medical): No    Lack of Transportation (Non-Medical): No  Physical Activity: Not on file  Stress: No Stress Concern Present (12/03/2021)   [employer] of Occupational Health - Occupational Stress Questionnaire    Feeling of Stress : Not at all  Social Connections: Unknown (12/03/2021)   Social Connection and Isolation Panel [NHANES]    Frequency of Communication with Friends and Family: More than three times a week    Frequency of Social Gatherings with Friends and Family: Not on file    Attends Religious Services: Not on file    Active Member of Clubs or Organizations: Not on file    Attends Banker Meetings: Not on file    Marital Status: Not on file    Tobacco Counseling Counseling given: Not  Answered   Clinical Intake:  Pre-visit preparation completed: Yes        Diabetes: No  How often do you need to have someone help you when you read instructions, pamphlets, or other written materials from your doctor or pharmacy?: 1 - Never    Interpreter Needed?: No      Activities of Daily Living    12/03/2021    3:43 PM  In your present state of health, do you have any difficulty performing the following activities:  Hearing? 1  Comment Followed by Audiology  Vision? 0  Comment Dx Mild Cognitive Impairment  Walking or climbing stairs? 0  Dressing or bathing? 0  Doing errands, shopping? 1  Comment Family/friend assist with transportation  Preparing Food and eating ? N  Using the Toilet? N  In the past six months, have you accidently leaked urine? N  Do you have problems with loss of bowel control? N  Managing your Medications? N  Managing your Finances? N  Housekeeping or managing your Housekeeping? N    Patient Care Team: Tower, Audrie Gallus, MD as PCP - General Rosenow, Deloris Ping, MD (Unknown Physician Specialty) Kieth Brightly, MD (General Surgery)  Indicate any recent Medical Services you may have received from other than [facility] providers in the past year (date may be approximate).     Assessment:   This is a routine wellness examination for Kristin Barton.   Kristin Barton , Thank you for taking time to come for your Medicare Wellness Visit. I appreciate your ongoing commitment to your health goals. Please review the following plan we discussed and let me know if I can assist you in the future.   These are the goals we discussed:  Goals      Maintain healthy lifestyle     Stay active Healthy diet        This is a list of the screening recommended for you and due dates:  Health Maintenance  Topic Date Due   COVID-19 Vaccine (1) 12/19/2021*   Pneumonia Vaccine (1 - PCV) 01/31/2022*   Tetanus Vaccine  01/31/2022*   DEXA scan (bone density  measurement)  03/03/2022*   Zoster (Shingles) Vaccine (1 of 2) 03/05/2022*   Flu Shot  05/01/2022*   Mammogram  05/27/2022   Medicare Annual Wellness Visit  12/04/2022   Colon Cancer Screening  01/23/2024   Hepatitis C Screening: USPSTF Recommendation to screen - Ages 18-79 yo.  Completed  HPV Vaccine  Aged Out  *Topic was postponed. The date shown is not the original due date.     Hearing/Vision screen Hearing Screening - Comments:: In the process of getting hearing aids through Audiologist in [place] - Comments:: Does not wear glasses They have seen their ophthalmologist in the last 12 months.    Dietary issues and exercise activities discussed: Current Exercise Habits: Home exercise routine, Type of exercise: walking, Time (Minutes): 15, Frequency (Times/Week): 7, Weekly Exercise (Minutes/Week): 105, Intensity: Mild Regular diet   Goals Addressed             This Visit's Progress    Maintain healthy lifestyle       Stay active Healthy diet       Depression Screen    12/03/2021    3:43 PM 12/02/2020    6:58 PM 12/02/2020    5:06 PM 08/07/2019    3:31 PM  PHQ 2/9 Scores  PHQ - 2 Score 0 0 0 0    Fall Risk    12/03/2021    3:43 PM 12/02/2020    3:22 PM  [place] in the past year? 0 0  Number falls in past yr: 0   Injury with Fall? 0   Follow up Falls evaluation completed;Falls prevention discussed Falls evaluation completed    FALL RISK PREVENTION PERTAINING TO THE HOME: Home free of loose throw rugs in walkways, pet beds, electrical cords, etc? Yes  Adequate lighting in your home to reduce risk of falls? Yes   ASSISTIVE DEVICES UTILIZED TO PREVENT FALLS: Life alert? No  Use of a cane, walker or w/c? No   TIMED UP AND GO: Was the test performed? No .   Cognitive Function:        12/03/2021    3:45 PM  6CIT Screen  What Year? 0 points  What month? 0 points  What time? 0 points  Count back from 20 0 points  Months in  reverse 0 points  Repeat phrase 0 points  Total Score 0 points    Immunizations Immunization History  Administered Date(s) Administered   Tdap 08/23/2010    TDAP status: Due, Education has been provided regarding the importance of this vaccine. Advised may receive this vaccine at local pharmacy or Health Dept. Aware to provide a copy of the vaccination record if obtained from local pharmacy or Health Dept. Verbalized acceptance and understanding.  Flu Vaccine status: Declined, Education has been provided regarding the importance of this vaccine but patient still declined. Advised may receive this vaccine at local pharmacy or Health Dept. Aware to provide a copy of the vaccination record if obtained from local pharmacy or Health Dept. Verbalized acceptance and understanding.  Pneumococcal vaccine status: Declined,  Education has been provided regarding the importance of this vaccine but patient still declined. Advised may receive this vaccine at local pharmacy or Health Dept. Aware to provide a copy of the vaccination record if obtained from local pharmacy or Health Dept. Verbalized acceptance and understanding.   Covid-19 vaccine status: Declined, Education has been provided regarding the importance of this vaccine but patient still declined. Advised may receive this vaccine at local pharmacy or Health Dept.or vaccine clinic. Aware to provide a copy of the vaccination record if obtained from local pharmacy or Health Dept. Verbalized acceptance and understanding.   Shingrix Completed?: No.    Education has been provided regarding the importance of this vaccine. Patient has been advised to call  insurance company to determine out of pocket expense if they have not yet received this vaccine. Advised may also receive vaccine at local pharmacy or Health Dept. Verbalized acceptance and understanding.  Screening Tests Health Maintenance  Topic Date Due   COVID-19 Vaccine (1) 12/19/2021 (Originally  03/28/1956)   Pneumonia Vaccine 40+ Years old (1 - PCV) 01/31/2022 (Originally 09/25/2020)   TETANUS/TDAP  01/31/2022 (Originally 08/22/2020)   DEXA SCAN  03/03/2022 (Originally 09/25/2020)   Zoster Vaccines- Shingrix (1 of 2) 03/05/2022 (Originally 09/25/2005)   INFLUENZA VACCINE  05/01/2022 (Originally 08/31/2021)   MAMMOGRAM  05/27/2022   Medicare Annual Wellness (AWV)  12/04/2022   COLONOSCOPY (Pts 45-52yrs Insurance coverage will need to be confirmed)  01/23/2024   Hepatitis C Screening  Completed   HPV VACCINES  Aged Out    Health Maintenance There are no preventive care reminders to display for this patient.  Bone Density deferred per patient.   Lung Cancer Screening: (Low Dose CT Chest recommended if Age 39-80 years, 30 pack-year currently smoking OR have quit w/in 15years.) does not qualify.   Vision Screening: Recommended annual ophthalmology exams for early detection of glaucoma and other disorders of the eye.  Dental Screening: Recommended annual dental exams for proper oral hygiene  Community Resource Referral / Chronic Care Management: CRR required this visit?  No   CCM required this visit?  No      Plan:     I have personally reviewed and noted the following in the patient's chart:   Medical and social history Use of alcohol, tobacco or illicit drugs  Current medications and supplements including opioid prescriptions. Patient is not currently taking opioid prescriptions. Functional ability and status Nutritional status Physical activity Advanced directives List of other physicians Hospitalizations, surgeries, and ER visits in previous 12 months Vitals Screenings to include cognitive, depression, and falls Referrals and appointments  In addition, I have reviewed and discussed with patient certain preventive protocols, quality metrics, and best practice recommendations. A written personalized care plan for preventive services as well as general preventive health  recommendations were provided to patient.     Cathey Endow, LPN   [id number]

## 2021-12-06 NOTE — Progress Notes (Signed)
Second note for visit.  I connected with  Ronnell Freshwater on 12/03/21 by a audio enabled telemedicine application and verified that I am speaking with the correct person using two identifiers.  Patient Location: Home  Provider Location: Office/Clinic  I discussed the limitations of evaluation and management by telemedicine. The patient expressed understanding and agreed to proceed.

## 2021-12-06 NOTE — Addendum Note (Signed)
Addended by: Leta Jungling on: 12/06/2021 11:22 AM   Modules accepted: Orders, Level of Service

## 2021-12-10 ENCOUNTER — Encounter: Payer: Self-pay | Admitting: Family Medicine

## 2021-12-10 ENCOUNTER — Ambulatory Visit (INDEPENDENT_AMBULATORY_CARE_PROVIDER_SITE_OTHER): Payer: Medicare HMO | Admitting: Family Medicine

## 2021-12-10 VITALS — BP 134/92 | HR 76 | Temp 97.4°F | Ht 63.0 in | Wt 175.4 lb

## 2021-12-10 DIAGNOSIS — M79604 Pain in right leg: Secondary | ICD-10-CM

## 2021-12-10 DIAGNOSIS — Z23 Encounter for immunization: Secondary | ICD-10-CM | POA: Diagnosis not present

## 2021-12-10 DIAGNOSIS — I10 Essential (primary) hypertension: Secondary | ICD-10-CM | POA: Diagnosis not present

## 2021-12-10 DIAGNOSIS — R7303 Prediabetes: Secondary | ICD-10-CM | POA: Diagnosis not present

## 2021-12-10 DIAGNOSIS — N1831 Chronic kidney disease, stage 3a: Secondary | ICD-10-CM | POA: Diagnosis not present

## 2021-12-10 DIAGNOSIS — G3184 Mild cognitive impairment, so stated: Secondary | ICD-10-CM

## 2021-12-10 DIAGNOSIS — M79606 Pain in leg, unspecified: Secondary | ICD-10-CM | POA: Insufficient documentation

## 2021-12-10 DIAGNOSIS — Z0000 Encounter for general adult medical examination without abnormal findings: Secondary | ICD-10-CM

## 2021-12-10 MED ORDER — LISINOPRIL 40 MG PO TABS: 40.0000 mg | ORAL_TABLET | Freq: Every day | ORAL | 3 refills | Status: DC

## 2021-12-10 MED ORDER — AMLODIPINE BESYLATE 5 MG PO TABS: 5.0000 mg | ORAL_TABLET | Freq: Every day | ORAL | 3 refills | Status: DC

## 2021-12-10 NOTE — Assessment & Plan Note (Signed)
Lab today  On lisinopril Enc good water intake

## 2021-12-10 NOTE — Patient Instructions (Addendum)
Lets do your flu shot and your pneumonia shot today   You can get a flu shot once per year The pneumonia shot is just once (today)   I want you to come back for an xray of your leg  When you check out - you can make an appt for the xray   Get Voltaren gel 1% over the counter  Rub a small amount on the area that hurts up to four times daily as needed   A cold compress may help also   Labs today   I will send in your blood pressure medicine   Eat a healthy diet  Try to get most of your carbohydrates from produce (with the exception of white potatoes)  Eat less bread/pasta/rice/snack foods/cereals/sweets and other items from the middle of the grocery store (processed carbs)

## 2021-12-10 NOTE — Assessment & Plan Note (Signed)
Pt notes now progression of this Does well when not anxious Works on word puzzles Does interact with family

## 2021-12-10 NOTE — Progress Notes (Unsigned)
Subjective:    Patient ID: Kristin Barton, female    DOB: 24-Sep-1955, 66 y.o.   MRN: [id number]  HPI Here for health maintenance exam and to review chronic medical problems    Wt Readings from Last 3 Encounters:  12/10/21 175 lb 6 oz (79.5 kg)  12/03/21 161 lb (73 kg)  12/02/20 161 lb 4 oz (73.1 kg)   31.07 kg/m  Feeling ok   Has pain in her right leg at night   (worse at night /occ a little in the day)  When lying in bed - hurts in her shin bone  Sometimes she need to bend it  Uses icy hot  Occ cramps in her let as well - goes into her toes   No trauma or new activity  Knee is fine  No swelling   Started mustard - is helping that   Walks up and down the stairs for exercise  Does leg lifts  Does not walk outside /neighborhood is not safe  Stays close to home   Immunization History  Administered Date(s) Administered   Tdap 08/23/2010   There are no preventive care reminders to display for this patient.  Wants the flu shot and pneumonia shot today   Tetanus shot postponed for ins Declines shingrix  Mammogram 05/2021 Self breast exam: no lumps   Has had a hysterectomy   Colonoscopy 12/2013 with 10 y recall    HTN bp is stable today (missed her medicine today)  No cp or palpitations or headaches or edema  No side effects to medicines  BP Readings from Last 3 Encounters:  12/10/21 (!) 134/92  12/02/20 138/88  06/03/20 130/80     Amlodipine 5 mg daily  Lisinopril 40 mg daily    Lab Results  Component Value Date   CREATININE 1.12 12/02/2020   BUN 10 12/02/2020   NA 141 12/02/2020   K 4.0 12/02/2020   CL 103 12/02/2020   CO2 31 12/02/2020   H/o CKD  Prediabetes Lab Results  Component Value Date   HGBA1C 5.9 12/02/2020     Hyperlipidemia  Lab Results  Component Value Date   CHOL 202 (H) 12/02/2020   HDL 66.80 12/02/2020   LDLCALC 116 (H) 12/02/2020   TRIG 93.0 12/02/2020   CHOLHDL 3 12/02/2020   Has to force the vegetables  Tries  to eat a healthy vegetables  Avoids fried and fatty food  Not a lot of fast food   Would rather have a salad   Watches tv much of the day and works on word puzzles Does not think her memory has changes  No longer wants to cook at all  Does not drive  Not very active   Patient Active Problem List   Diagnosis Date Noted   Welcome to Medicare preventive visit 12/02/2020   Prediabetes 08/07/2019   Mild cognitive impairment with memory loss 08/14/2018   Headache disorder 08/14/2018   Stress reaction 03/13/2018   CKD (chronic kidney disease), stage III (HCC) 03/09/2018   Right knee pain 03/22/2017   Lumbar pain 03/22/2017   Encounter for screening mammogram for breast cancer 12/29/2016   Screening for HIV (human immunodeficiency virus) 01/11/2016   Elevated serum creatinine 06/17/2014   H/O syncope 11/22/2013   Special screening for malignant neoplasms, colon 08/23/2010   Routine general medical examination at a health care facility 08/17/2010   HYPERTENSION, BENIGN ESSENTIAL 01/22/2009   Past Medical History:  Diagnosis Date   Abnormal urinalysis 06/18/2014  Elevated serum creatinine 06/17/2014   With GFR of 53    Hypertension    Hypokalemia    Inflamed skin tag 06/14/2013   Need for hepatitis C screening test 01/11/2016   Obesity 07/01/2014   Past Surgical History:  Procedure Laterality Date   CESAREAN SECTION  1987   PARTIAL HYSTERECTOMY     for cyst, has one ovary remaining (no hx of abn pap smears)   TUBAL LIGATION     Social History   Tobacco Use   Smoking status: Never   Smokeless tobacco: Never  Substance Use Topics   Alcohol use: No    Alcohol/week: 0.0 standard drinks of alcohol   Drug use: No   Family History  Problem Relation Age of Onset   Hypertension Mother    Asthma Father    COPD Father        cigars   Lung cancer Father    Breast cancer Sister    Diabetes Sister        mild   Breast cancer Sister    Other Other        ? heart problems-  distant family   Mental illness Other        ? in family   No Known Allergies No current outpatient medications on file prior to visit.   No current facility-administered medications on file prior to visit.    Review of Systems  Constitutional:  Negative for activity change, appetite change, fatigue, fever and unexpected weight change.  HENT:  Negative for congestion, ear pain, rhinorrhea, sinus pressure and sore throat.   Eyes:  Negative for pain, redness and visual disturbance.  Respiratory:  Negative for cough, shortness of breath and wheezing.   Cardiovascular:  Negative for chest pain and palpitations.  Gastrointestinal:  Negative for abdominal pain, blood in stool, constipation and diarrhea.  Endocrine: Negative for polydipsia and polyuria.  Genitourinary:  Negative for dysuria, frequency and urgency.  Musculoskeletal:  Positive for arthralgias. Negative for back pain and myalgias.       Pain in R shin worse at night   Skin:  Negative for pallor and rash.  Allergic/Immunologic: Negative for environmental allergies.  Neurological:  Negative for dizziness, syncope and headaches.  Hematological:  Negative for adenopathy. Does not bruise/bleed easily.  Psychiatric/Behavioral:  Negative for decreased concentration and dysphoric mood. The patient is not nervous/anxious.        Objective:   Physical Exam Constitutional:      General: She is not in acute distress.    Appearance: Normal appearance. She is well-developed. She is obese. She is not ill-appearing or diaphoretic.  HENT:     Head: Normocephalic and atraumatic.     Right Ear: Tympanic membrane, ear canal and external ear normal.     Left Ear: Tympanic membrane, ear canal and external ear normal.     Nose: Nose normal. No congestion.     Mouth/Throat:     Mouth: Mucous membranes are moist.     Pharynx: Oropharynx is clear. No posterior oropharyngeal erythema.  Eyes:     General: No scleral icterus.    Extraocular  Movements: Extraocular movements intact.     Conjunctiva/sclera: Conjunctivae normal.     Pupils: Pupils are equal, round, and reactive to light.  Neck:     Thyroid: No thyromegaly.     Vascular: No carotid bruit or JVD.  Cardiovascular:     Rate and Rhythm: Normal rate and regular rhythm.  Pulses: Normal pulses.     Heart sounds: Normal heart sounds.     No gallop.  Pulmonary:     Effort: Pulmonary effort is normal. No respiratory distress.     Breath sounds: Normal breath sounds. No wheezing.     Comments: Good air exch Chest:     Chest wall: No tenderness.  Abdominal:     General: Bowel sounds are normal. There is no distension or abdominal bruit.     Palpations: Abdomen is soft. There is no mass.     Tenderness: There is no abdominal tenderness.     Hernia: No hernia is present.  Genitourinary:    Comments: Breast exam: No mass, nodules, thickening, tenderness, bulging, retraction, inflamation, nipple discharge or skin changes noted.  No axillary or clavicular LA.     Musculoskeletal:        General: No tenderness. Normal range of motion.     Cervical back: Normal range of motion and neck supple. No rigidity. No muscular tenderness.     Right lower leg: No edema.     Left lower leg: No edema.     Comments: No kyphosis   Lymphadenopathy:     Cervical: No cervical adenopathy.  Skin:    General: Skin is warm and dry.     Coloration: Skin is not pale.     Findings: No erythema or rash.  Neurological:     Mental Status: She is alert. Mental status is at baseline.     Cranial Nerves: No cranial nerve deficit.     Motor: No abnormal muscle tone.     Coordination: Coordination normal.     Gait: Gait normal.     Deep Tendon Reflexes: Reflexes normal.  Psychiatric:        Mood and Affect: Mood normal.        Cognition and Memory: Cognition and memory normal.           Assessment & Plan:   Problem List Items Addressed This Visit       Cardiovascular and  Mediastinum   HYPERTENSION, BENIGN ESSENTIAL    Missed med today but has been in good control bp in fair control at this time  BP Readings from Last 1 Encounters:  12/10/21 (!) 134/92  No changes needed Most recent labs reviewed  Disc lifstyle change with low sodium diet and exercise  Plan to continue amlodipine 5 mg daily  Lisinopril 40 mg daily  Labs ordered       Relevant Medications   amLODipine (NORVASC) 5 MG tablet   lisinopril (ZESTRIL) 40 MG tablet   Other Relevant Orders   TSH (Completed)   Lipid panel (Completed)   Comprehensive metabolic panel (Completed)   CBC with Differential/Platelet (Completed)     Nervous and Auditory   Mild cognitive impairment with memory loss    Pt notes now progression of this Does well when not anxious Works on word puzzles Does interact with family         Genitourinary   CKD (chronic kidney disease), stage III (HCC)    Lab today  On lisinopril Enc good water intake         Other   Leg pain    Shin area-esp at night  On exam some tenderness of prox tibia just under knee  ? If from knee Nl circulation and no findings of dvt  Will have her return for xr on another day      Prediabetes  Lab Results  Component Value Date   HGBA1C 6.0 (H) 12/10/2021  disc imp of low glycemic diet and wt loss to prevent DM2        Relevant Orders   Hemoglobin A1c (Completed)   Routine general medical examination at a health care facility - Primary    Reviewed health habits including diet and exercise and skin cancer prevention Reviewed appropriate screening tests for age  Also reviewed health mt list, fam hx and immunization status , as well as social and family history   Flu shot and prevnar 20 vaccine today  Mammogram utd 05/2021 Colonoscopy 12/2013 with 10 y recall        Other Visit Diagnoses     Essential hypertension       Relevant Medications   amLODipine (NORVASC) 5 MG tablet   lisinopril (ZESTRIL) 40 MG tablet    Need for influenza vaccination       Relevant Orders   Flu Vaccine QUAD High Dose(Fluad) (Completed)   Need for vaccination against Streptococcus pneumoniae       Relevant Orders   Pneumococcal conjugate vaccine 20-valent (Prevnar 20) (Completed)

## 2021-12-10 NOTE — Assessment & Plan Note (Signed)
Shin area-esp at night  On exam some tenderness of prox tibia just under knee  ? If from knee Nl circulation and no findings of dvt  Will have her return for xr on another day

## 2021-12-10 NOTE — Assessment & Plan Note (Addendum)
Missed med today but has been in good control bp in fair control at this time  BP Readings from Last 1 Encounters:  12/10/21 (!) 134/92   No changes needed Most recent labs reviewed  Disc lifstyle change with low sodium diet and exercise  Plan to continue amlodipine 5 mg daily  Lisinopril 40 mg daily  Labs ordered

## 2021-12-11 LAB — COMPREHENSIVE METABOLIC PANEL
AG Ratio: 1.6 (calc) (ref 1.0–2.5)
ALT: 14 U/L (ref 6–29)
AST: 19 U/L (ref 10–35)
Albumin: 4.4 g/dL (ref 3.6–5.1)
Alkaline phosphatase (APISO): 64 U/L (ref 37–153)
BUN/Creatinine Ratio: 9 (calc) (ref 6–22)
BUN: 11 mg/dL (ref 7–25)
CO2: 27 mmol/L (ref 20–32)
Calcium: 9.1 mg/dL (ref 8.6–10.4)
Chloride: 103 mmol/L (ref 98–110)
Creat: 1.17 mg/dL — ABNORMAL HIGH (ref 0.50–1.05)
Globulin: 2.7 g/dL (calc) (ref 1.9–3.7)
Glucose, Bld: 84 mg/dL (ref 65–99)
Potassium: 3.7 mmol/L (ref 3.5–5.3)
Sodium: 140 mmol/L (ref 135–146)
Total Bilirubin: 0.3 mg/dL (ref 0.2–1.2)
Total Protein: 7.1 g/dL (ref 6.1–8.1)

## 2021-12-11 LAB — LIPID PANEL
Cholesterol: 178 mg/dL (ref <200)
HDL: 52 mg/dL (ref >=50)
LDL Cholesterol (Calc): 98 mg/dL (calc)
Non-HDL Cholesterol (Calc): 126 mg/dL (calc) (ref <130)
Total CHOL/HDL Ratio: 3.4 (calc) (ref <5.0)
Triglycerides: 190 mg/dL — ABNORMAL HIGH (ref <150)

## 2021-12-11 LAB — CBC WITH DIFFERENTIAL/PLATELET
Absolute Monocytes: 464 cells/uL (ref 200–950)
Basophils Absolute: 29 cells/uL (ref 0–200)
Basophils Relative: 0.5 %
Eosinophils Absolute: 110 cells/uL (ref 15–500)
Eosinophils Relative: 1.9 %
HCT: 38.6 % (ref 35.0–45.0)
Hemoglobin: 13.1 g/dL (ref 11.7–15.5)
Lymphs Abs: 1989 cells/uL (ref 850–3900)
MCH: 30.3 pg (ref 27.0–33.0)
MCHC: 33.9 g/dL (ref 32.0–36.0)
MCV: 89.4 fL (ref 80.0–100.0)
MPV: 10.5 fL (ref 7.5–12.5)
Monocytes Relative: 8 %
Neutro Abs: 3207 cells/uL (ref 1500–7800)
Neutrophils Relative %: 55.3 %
Platelets: 244 10*3/uL (ref 140–400)
RBC: 4.32 10*6/uL (ref 3.80–5.10)
RDW: 13.8 % (ref 11.0–15.0)
Total Lymphocyte: 34.3 %
WBC: 5.8 10*3/uL (ref 3.8–10.8)

## 2021-12-11 LAB — HEMOGLOBIN A1C
Hgb A1c MFr Bld: 6 % of total Hgb — ABNORMAL HIGH (ref <5.7)
Mean Plasma Glucose: 126 mg/dL
eAG (mmol/L): 7 mmol/L

## 2021-12-11 LAB — TSH: TSH: 2.6 mIU/L (ref 0.40–4.50)

## 2021-12-12 NOTE — Assessment & Plan Note (Addendum)
Reviewed health habits including diet and exercise and skin cancer prevention Reviewed appropriate screening tests for age  Also reviewed health mt list, fam hx and immunization status , as well as social and family history   Flu shot and prevnar 20 vaccine today  Mammogram utd 05/2021 Colonoscopy 12/2013 with 10 y recall  Labs ordered

## 2021-12-12 NOTE — Assessment & Plan Note (Signed)
Lab Results  Component Value Date   HGBA1C 6.0 (H) 12/10/2021   disc imp of low glycemic diet and wt loss to prevent DM2

## 2021-12-16 ENCOUNTER — Other Ambulatory Visit: Payer: Self-pay | Admitting: Nurse Practitioner

## 2021-12-16 ENCOUNTER — Ambulatory Visit (INDEPENDENT_AMBULATORY_CARE_PROVIDER_SITE_OTHER)
Admission: RE | Admit: 2021-12-16 | Discharge: 2021-12-16 | Disposition: A | Discharge status: Home / Self Care | Payer: Medicare HMO | Source: Ambulatory Visit | Attending: Nurse Practitioner | Admitting: Nurse Practitioner

## 2021-12-16 DIAGNOSIS — M25561 Pain in right knee: Secondary | ICD-10-CM | POA: Diagnosis not present

## 2021-12-16 DIAGNOSIS — M1711 Unilateral primary osteoarthritis, right knee: Secondary | ICD-10-CM | POA: Diagnosis not present

## 2021-12-16 DIAGNOSIS — M79604 Pain in right leg: Secondary | ICD-10-CM

## 2021-12-16 DIAGNOSIS — M79661 Pain in right lower leg: Secondary | ICD-10-CM | POA: Diagnosis not present

## 2021-12-21 ENCOUNTER — Telehealth: Payer: Self-pay | Admitting: Family Medicine

## 2021-12-21 DIAGNOSIS — M25561 Pain in right knee: Secondary | ICD-10-CM

## 2021-12-21 DIAGNOSIS — M79604 Pain in right leg: Secondary | ICD-10-CM

## 2021-12-21 NOTE — Telephone Encounter (Signed)
-----   Message from Shon Millet, [place] sent at 12/20/2021  2:47 PM EST ----- Pt notified of lab results and Dr. Royden Purl comments, pt agrees with Ortho referral she would like to see someone in [place] if possible. Pt advised referral can take up to 2 weeks so if she hasn't heard anything regarding referral in 2 weeks to let us know.

## 2022-01-06 DIAGNOSIS — M25561 Pain in right knee: Secondary | ICD-10-CM | POA: Diagnosis not present

## 2022-01-06 DIAGNOSIS — M1711 Unilateral primary osteoarthritis, right knee: Secondary | ICD-10-CM | POA: Diagnosis not present

## 2022-04-25 ENCOUNTER — Other Ambulatory Visit: Payer: Self-pay | Admitting: Family Medicine

## 2022-04-25 DIAGNOSIS — Z1231 Encounter for screening mammogram for malignant neoplasm of breast: Secondary | ICD-10-CM

## 2022-05-31 ENCOUNTER — Ambulatory Visit
Admission: RE | Admit: 2022-05-31 | Discharge: 2022-05-31 | Disposition: A | Discharge status: Home / Self Care | Payer: Medicare PPO | Source: Ambulatory Visit | Attending: Family Medicine | Admitting: Family Medicine

## 2022-05-31 DIAGNOSIS — Z1231 Encounter for screening mammogram for malignant neoplasm of breast: Secondary | ICD-10-CM | POA: Diagnosis not present

## 2022-12-04 ENCOUNTER — Telehealth: Payer: Self-pay | Admitting: Family Medicine

## 2022-12-04 DIAGNOSIS — R7303 Prediabetes: Secondary | ICD-10-CM

## 2022-12-04 DIAGNOSIS — I10 Essential (primary) hypertension: Secondary | ICD-10-CM

## 2022-12-04 NOTE — Telephone Encounter (Signed)
-----   Message from Vincenza Hews sent at 11/23/2022  2:36 PM EDT ----- Regarding: Lab Tues 12/06/22 Hello,  Patient is coming in for CPE labs on Tuesday 12/06/22. Can we get orders please.   Thanks

## 2022-12-06 ENCOUNTER — Telehealth: Payer: Self-pay | Admitting: Family Medicine

## 2022-12-06 ENCOUNTER — Other Ambulatory Visit (INDEPENDENT_AMBULATORY_CARE_PROVIDER_SITE_OTHER): Payer: Medicare PPO

## 2022-12-06 DIAGNOSIS — I10 Essential (primary) hypertension: Secondary | ICD-10-CM

## 2022-12-06 DIAGNOSIS — R7303 Prediabetes: Secondary | ICD-10-CM | POA: Diagnosis not present

## 2022-12-06 MED ORDER — AMLODIPINE BESYLATE 5 MG PO TABS: 5.0000 mg | ORAL_TABLET | Freq: Every day | ORAL | 0 refills | Status: DC

## 2022-12-06 MED ORDER — LISINOPRIL 40 MG PO TABS: 40.0000 mg | ORAL_TABLET | Freq: Every day | ORAL | 0 refills | Status: DC

## 2022-12-06 NOTE — Telephone Encounter (Signed)
Prescription Request  12/06/2022  LOV: 12/10/2021  What is the name of the medication or equipment? amLODipine (NORVASC) 5 MG tablet  lisinopril (ZESTRIL) 40 MG tablet  Have you contacted your pharmacy to request a refill? Yes   Which pharmacy would you like this sent to?  [id number] - [place] ([place]), [place] - [address] [place] ([place]) [place] [address] Phone: [phone] Fax: [phone]    Patient notified that their request is being sent to the clinical staff for review and that they should receive a response within 2 business days.   Please advise at [facility] [phone]

## 2022-12-07 LAB — COMPREHENSIVE METABOLIC PANEL
ALT: 11 U/L (ref 0–35)
AST: 16 U/L (ref 0–37)
Albumin: 4.2 g/dL (ref 3.5–5.2)
Alkaline Phosphatase: 58 U/L (ref 39–117)
BUN: 16 mg/dL (ref 6–23)
CO2: 29 meq/L (ref 19–32)
Calcium: 9.2 mg/dL (ref 8.4–10.5)
Chloride: 103 meq/L (ref 96–112)
Creatinine, Ser: 1.27 mg/dL — ABNORMAL HIGH (ref 0.40–1.20)
GFR: 43.86 mL/min — ABNORMAL LOW (ref >60.00)
Glucose, Bld: 106 mg/dL — ABNORMAL HIGH (ref 70–99)
Potassium: 3.3 meq/L — ABNORMAL LOW (ref 3.5–5.1)
Sodium: 142 meq/L (ref 135–145)
Total Bilirubin: 0.4 mg/dL (ref 0.2–1.2)
Total Protein: 6.8 g/dL (ref 6.0–8.3)

## 2022-12-07 LAB — CBC WITH DIFFERENTIAL/PLATELET
Basophils Absolute: 0.1 10*3/uL (ref 0.0–0.1)
Basophils Relative: 1.4 % (ref 0.0–3.0)
Eosinophils Absolute: 0.1 10*3/uL (ref 0.0–0.7)
Eosinophils Relative: 1.8 % (ref 0.0–5.0)
HCT: 37.5 % (ref 36.0–46.0)
Hemoglobin: 12.3 g/dL (ref 12.0–15.0)
Lymphocytes Relative: 36 % (ref 12.0–46.0)
Lymphs Abs: 1.9 10*3/uL (ref 0.7–4.0)
MCHC: 32.8 g/dL (ref 30.0–36.0)
MCV: 92 fL (ref 78.0–100.0)
Monocytes Absolute: 0.4 10*3/uL (ref 0.1–1.0)
Monocytes Relative: 7.3 % (ref 3.0–12.0)
Neutro Abs: 2.9 10*3/uL (ref 1.4–7.7)
Neutrophils Relative %: 53.5 % (ref 43.0–77.0)
Platelets: 254 10*3/uL (ref 150.0–400.0)
RBC: 4.08 Mil/uL (ref 3.87–5.11)
RDW: 14.5 % (ref 11.5–15.5)
WBC: 5.4 10*3/uL (ref 4.0–10.5)

## 2022-12-07 LAB — LIPID PANEL
Cholesterol: 181 mg/dL (ref 0–200)
HDL: 58.4 mg/dL (ref >39.00)
LDL Cholesterol: 104 mg/dL — ABNORMAL HIGH (ref 0–99)
NonHDL: 122.74
Total CHOL/HDL Ratio: 3
Triglycerides: 93 mg/dL (ref 0.0–149.0)
VLDL: 18.6 mg/dL (ref 0.0–40.0)

## 2022-12-07 LAB — HEMOGLOBIN A1C: Hgb A1c MFr Bld: 6 % (ref 4.6–6.5)

## 2022-12-07 LAB — TSH: TSH: 1.63 u[IU]/mL (ref 0.35–5.50)

## 2022-12-12 ENCOUNTER — Ambulatory Visit (INDEPENDENT_AMBULATORY_CARE_PROVIDER_SITE_OTHER): Payer: Medicare PPO

## 2022-12-12 VITALS — Ht 63.0 in | Wt 155.0 lb

## 2022-12-12 DIAGNOSIS — Z780 Asymptomatic menopausal state: Secondary | ICD-10-CM

## 2022-12-12 DIAGNOSIS — Z0100 Encounter for examination of eyes and vision without abnormal findings: Secondary | ICD-10-CM

## 2022-12-12 DIAGNOSIS — Z0000 Encounter for general adult medical examination without abnormal findings: Secondary | ICD-10-CM | POA: Diagnosis not present

## 2022-12-12 NOTE — Progress Notes (Signed)
Subjective:   Kristin Barton is a 67 y.o. female who presents for Medicare Annual (Subsequent) preventive examination.  Visit Complete: Virtual I connected with  Lorn Junes on 12/12/22 by a audio enabled telemedicine application and verified that I am speaking with the correct person using two identifiers.  Patient Location: Home  Provider Location: Home Office  I discussed the limitations of evaluation and management by telemedicine. The patient expressed understanding and agreed to proceed.  Vital Signs: Because this visit was a virtual/telehealth visit, some criteria may be missing or patient reported. Any vitals not documented were not able to be obtained and vitals that have been documented are patient reported.  Patient Medicare AWV questionnaire was completed by the patient on 12/12/2022; I have confirmed that all information answered by patient is correct and no changes since this date.  Cardiac Risk Factors include: advanced age (>25men, >91 women);hypertension     Objective:    Today's Vitals   12/12/22 1504  Weight: 155 lb (70.3 kg)  Height: 5\' 3"  (1.6 m)   Body mass index is 27.46 kg/m.     12/12/2022    3:09 PM 12/03/2021    4:26 PM 03/09/2018    5:00 AM 03/09/2018   12:09 AM 03/19/2017    3:08 PM  Advanced Directives  Does Patient Have a Medical Advance Directive? Yes No No No No  Type of Estate agent of [place];Living will      Copy of Healthcare Power of Attorney in Chart? No - copy requested      Would patient like information on creating a medical advance directive?  No - Patient declined No - Patient declined  No - Patient declined    Current Medications (verified) Outpatient Encounter Medications as of 12/12/2022  Medication Sig   amLODipine (NORVASC) 5 MG tablet Take 1 tablet (5 mg total) by mouth daily.   fluticasone (FLONASE) 50 MCG/ACT nasal spray Place into the nose.   lisinopril (ZESTRIL) 40 MG tablet Take 1 tablet  (40 mg total) by mouth daily.   No facility-administered encounter medications on file as of 12/12/2022.    Allergies (verified) Patient has no known allergies.   History: Past Medical History:  Diagnosis Date   Abnormal urinalysis 06/18/2014   Elevated serum creatinine 06/17/2014   With GFR of 53    Hypertension    Hypokalemia    Inflamed skin tag 06/14/2013   Need for hepatitis C screening test 01/11/2016   Obesity 07/01/2014   Past Surgical History:  Procedure Laterality Date   CESAREAN SECTION  1987   PARTIAL HYSTERECTOMY     for cyst, has one ovary remaining (no hx of abn pap smears)   TUBAL LIGATION     Family History  Problem Relation Age of Onset   Hypertension Mother    Asthma Father    COPD Father        cigars   Lung cancer Father    Breast cancer Sister    Diabetes Sister        mild   Breast cancer Sister    Other Other        ? heart problems- distant family   Mental illness Other        ? in family   Social History   Socioeconomic History   Marital status: Divorced    Spouse name: Not on file   Number of children: 1   Years of education: 12   Highest education level:  Not on file  Occupational History   Occupation: Manufacturing engineer co    Employer: [employer]    Comment: 3rd shift  Tobacco Use   Smoking status: Never   Smokeless tobacco: Never  Substance and Sexual Activity   Alcohol use: No    Alcohol/week: 0.0 standard drinks of alcohol   Drug use: No   Sexual activity: Not on file  Other Topics Concern   Not on file  Social History Narrative   Regular exercise: walking   Cares for new grandchild         Social Determinants of Health   Financial Resource Strain: Low Risk  (12/12/2022)   Overall Financial Resource Strain (CARDIA)    Difficulty of Paying Living Expenses: Not hard at all  Food Insecurity: No Food Insecurity (12/12/2022)   Hunger Vital Sign    Worried About Running Out of Food in the Last Year: Never true     Ran Out of Food in the Last Year: Never true  Transportation Needs: No Transportation Needs (12/12/2022)   PRAPARE - Administrator, Civil Service (Medical): No    Lack of Transportation (Non-Medical): No  Physical Activity: Insufficiently Active (12/12/2022)   Exercise Vital Sign    Days of Exercise per Week: 3 days    Minutes of Exercise per Session: 30 min  Stress: No Stress Concern Present (12/12/2022)   [employer] of Occupational Health - Occupational Stress Questionnaire    Feeling of Stress : Not at all  Social Connections: Socially Isolated (12/12/2022)   Social Connection and Isolation Panel [NHANES]    Frequency of Communication with Friends and Family: More than three times a week    Frequency of Social Gatherings with Friends and Family: More than three times a week    Attends Religious Services: Never    Database administrator or Organizations: No    Attends Engineer, structural: Never    Marital Status: Divorced    Tobacco Counseling Counseling given: Not Answered   Clinical Intake:  Pre-visit preparation completed: Yes  Pain : No/denies pain     Nutritional Risks: None Diabetes: No  How often do you need to have someone help you when you read instructions, pamphlets, or other written materials from your doctor or pharmacy?: 1 - Never  Interpreter Needed?: No  Information entered by :: Renie Ora, LPN   Activities of Daily Living    12/12/2022    3:09 PM  In your present state of health, do you have any difficulty performing the following activities:  Hearing? 0  Vision? 0  Difficulty concentrating or making decisions? 0  Walking or climbing stairs? 0  Dressing or bathing? 0  Doing errands, shopping? 0  Preparing Food and eating ? N  Using the Toilet? N  In the past six months, have you accidently leaked urine? N  Do you have problems with loss of bowel control? N  Managing your Medications? N  Managing your  Finances? N  Housekeeping or managing your Housekeeping? N    Patient Care Team: Tower, Audrie Gallus, MD as PCP - General Rosenow, Deloris Ping, MD (Unknown Physician Specialty) Kieth Brightly, MD (General Surgery)  Indicate any recent Medical Services you may have received from other than [facility] providers in the past year (date may be approximate).     Assessment:   This is a routine wellness examination for Kristin Barton.  Hearing/Vision screen Vision Screening - Comments:: Referral 12/12/2022  Goals Addressed             This Visit's Progress    DIET - EAT MORE FRUITS AND VEGETABLES         Depression Screen    12/12/2022    3:07 PM 12/03/2021    3:43 PM 12/02/2020    6:58 PM 12/02/2020    5:06 PM 08/07/2019    3:31 PM  PHQ 2/9 Scores  PHQ - 2 Score 0 0 0 0 0    Fall Risk    12/12/2022    3:06 PM 12/03/2021    3:43 PM 12/02/2020    3:22 PM  Fall Risk   Falls in the past year? 0 0 0  Number falls in past yr: 0 0   Injury with Fall? 0 0   Risk for fall due to : No Fall Risks    Follow up Falls prevention discussed Falls evaluation completed;Falls prevention discussed Falls evaluation completed    MEDICARE RISK AT HOME: Medicare Risk at Home Any stairs in or around the home?: Yes If so, are there any without handrails?: No Home free of loose throw rugs in walkways, pet beds, electrical cords, etc?: Yes Adequate lighting in your home to reduce risk of falls?: Yes Life alert?: No Use of a cane, walker or w/c?: No Grab bars in the bathroom?: Yes Shower chair or bench in shower?: No Elevated toilet seat or a handicapped toilet?: No  TIMED UP AND GO:  Was the test performed?  No    Cognitive Function:        12/12/2022    3:09 PM 12/03/2021    3:45 PM  6CIT Screen  What Year? 0 points 0 points  What month? 0 points 0 points  What time? 0 points 0 points  Count back from 20 0 points 0 points  Months in reverse 0 points 0 points  Repeat phrase 0 points 0  points  Total Score 0 points 0 points    Immunizations Immunization History  Administered Date(s) Administered   Fluad Quad(high Dose 65+) 12/10/2021   PNEUMOCOCCAL CONJUGATE-20 12/10/2021   Tdap 08/23/2010    TDAP status: Due, Education has been provided regarding the importance of this vaccine. Advised may receive this vaccine at local pharmacy or Health Dept. Aware to provide a copy of the vaccination record if obtained from local pharmacy or Health Dept. Verbalized acceptance and understanding.  Flu Vaccine status: Up to date  Pneumococcal vaccine status: Up to date  Covid-19 vaccine status: Declined, Education has been provided regarding the importance of this vaccine but patient still declined. Advised may receive this vaccine at local pharmacy or Health Dept.or vaccine clinic. Aware to provide a copy of the vaccination record if obtained from local pharmacy or Health Dept. Verbalized acceptance and understanding.  Qualifies for Shingles Vaccine? Yes   Zostavax completed No   Shingrix Completed?: No.    Education has been provided regarding the importance of this vaccine. Patient has been advised to call insurance company to determine out of pocket expense if they have not yet received this vaccine. Advised may also receive vaccine at local pharmacy or Health Dept. Verbalized acceptance and understanding.  Screening Tests Health Maintenance  Topic Date Due   Zoster Vaccines- Shingrix (1 of 2) Never done   DTaP/Tdap/Td (2 - Td or Tdap) 08/22/2020   DEXA SCAN  Never done   INFLUENZA VACCINE  09/01/2022   COVID-19 Vaccine (1 - 2023-24 season) Never done  MAMMOGRAM  05/31/2023   Medicare Annual Wellness (AWV)  12/12/2023   Colonoscopy  01/23/2024   Pneumonia Vaccine 38+ Years old  Completed   Hepatitis C Screening  Completed   HPV VACCINES  Aged Out    Health Maintenance  Health Maintenance Due  Topic Date Due   Zoster Vaccines- Shingrix (1 of 2) Never done    DTaP/Tdap/Td (2 - Td or Tdap) 08/22/2020   DEXA SCAN  Never done   INFLUENZA VACCINE  09/01/2022   COVID-19 Vaccine (1 - 2023-24 season) Never done    Colorectal cancer screening: Type of screening: Colonoscopy. Completed 01/22/2014. Repeat every 10 years  Mammogram status: Completed 05/31/2022. Repeat every year  Bone Density status: Ordered 12/12/2022. Pt provided with contact info and advised to call to schedule appt.  Lung Cancer Screening: (Low Dose CT Chest recommended if Age 78-80 years, 20 pack-year currently smoking OR have quit w/in 15years.) does not qualify.   Lung Cancer Screening Referral: n/a  Additional Screening:  Hepatitis C Screening: does not qualify; Completed  03/09/2018 Vision Screening: Recommended annual ophthalmology exams for early detection of glaucoma and other disorders of the eye. Is the patient up to date with their annual eye exam?  No  Who is the provider or what is the name of the office in which the patient attends annual eye exams? None , referral 12/12/2022 If pt is not established with a provider, would they like to be referred to a provider to establish care? No .   Dental Screening: Recommended annual dental exams for proper oral hygiene   Community Resource Referral / Chronic Care Management: CRR required this visit?  No   CCM required this visit?  No     Plan:     I have personally reviewed and noted the following in the patient's chart:   Medical and social history Use of alcohol, tobacco or illicit drugs  Current medications and supplements including opioid prescriptions. Patient is not currently taking opioid prescriptions. Functional ability and status Nutritional status Physical activity Advanced directives List of other physicians Hospitalizations, surgeries, and ER visits in previous 12 months Vitals Screenings to include cognitive, depression, and falls Referrals and appointments  In addition, I have reviewed and  discussed with patient certain preventive protocols, quality metrics, and best practice recommendations. A written personalized care plan for preventive services as well as general preventive health recommendations were provided to patient.     Lorrene Reid, LPN   16/11/[id number]   After Visit Summary: (MyChart) Due to this being a telephonic visit, the after visit summary with patients personalized plan was offered to patient via MyChart   Nurse Notes: Due Tdap Vaccine

## 2022-12-12 NOTE — Patient Instructions (Signed)
Kristin Barton , Thank you for taking time to come for your Medicare Wellness Visit. I appreciate your ongoing commitment to your health goals. Please review the following plan we discussed and let me know if I can assist you in the future.   Referrals/Orders/Follow-Ups/Clinician Recommendations: Aim for 30 minutes of exercise or brisk walking, 6-8 glasses of water, and 5 servings of fruits and vegetables each day.   This is a list of the screening recommended for you and due dates:  Health Maintenance  Topic Date Due   Zoster (Shingles) Vaccine (1 of 2) Never done   DTaP/Tdap/Td vaccine (2 - Td or Tdap) 08/22/2020   DEXA scan (bone density measurement)  Never done   Flu Shot  09/01/2022   COVID-19 Vaccine (1 - 2023-24 season) Never done   Mammogram  05/31/2023   Medicare Annual Wellness Visit  12/12/2023   Colon Cancer Screening  01/23/2024   Pneumonia Vaccine  Completed   Hepatitis C Screening  Completed   HPV Vaccine  Aged Out    Advanced directives: (Copy Requested) Please bring a copy of your health care power of attorney and living will to the office to be added to your chart at your convenience.  Next Medicare Annual Wellness Visit scheduled for next year: Yes  Insert Preventive Care attachment Insert FALL PREVENTION attachment if needed

## 2022-12-12 NOTE — Progress Notes (Unsigned)
Subjective:    Patient ID: Kristin Barton, female    DOB: [birth year], 67 y.o.   MRN: [id number]  HPI  Here for health maintenance exam and to review chronic medical problems   Wt Readings from Last 3 Encounters:  12/13/22 167 lb 8 oz (76 kg)  12/12/22 155 lb (70.3 kg)  12/10/21 175 lb 6 oz (79.5 kg)   30.15 kg/m  Vitals:   12/13/22 1510  BP: 126/82  Pulse: 77  Temp: 98.4 F (36.9 C)  SpO2: 97%    Immunization History  Administered Date(s) Administered   Fluad Quad(high Dose 65+) 12/10/2021   Fluad Trivalent(High Dose 65+) 12/13/2022   PNEUMOCOCCAL CONJUGATE-20 12/10/2021   Tdap 08/23/2010    Health Maintenance Due  Topic Date Due   DEXA SCAN  Never done   Tetanus shot   Flu shot -today   Needs TB screening for employment (patient sitting part time)  3 hours four days per week   Shingrix- declines   Has questions about power of attorney form    Mammogram 05/2022 Self breast exam- no lumps   Gyn health-no problems    Colon cancer screening  Colonoscopy 12/2013 Dr Evette Cristal at [facility]    Bone health  Dexa  -ordered /has not had yet  Falls- none  Fractures-none  Supplements  -none  Exercise :  Walks  Stairs - many times /for exercise  Some stretches and floor exercises    May be lactose intolerant  Milk- gives her gas    Mood    12/12/2022    3:07 PM 12/03/2021    3:43 PM 12/02/2020    6:58 PM 12/02/2020    5:06 PM 08/07/2019    3:31 PM  Depression screen PHQ 2/9  Decreased Interest 0 0 0 0 0  Down, Depressed, Hopeless 0 0 0 0 0  PHQ - 2 Score 0 0 0 0 0    HTN bp is stable today  No cp or palpitations or headaches or edema  No side effects to medicines  BP Readings from Last 3 Encounters:  12/13/22 126/82  12/10/21 (!) 134/92  12/02/20 138/88    Lisinopril 40 mg daily  Amlodipine 5 mg daily    Lab Results  Component Value Date   NA 142 12/06/2022   K 3.3 (L) 12/06/2022   CO2 29 12/06/2022   GLUCOSE 106 (H) 12/06/2022   BUN  16 12/06/2022   CREATININE 1.27 (H) 12/06/2022   CALCIUM 9.2 12/06/2022   GFR 43.86 (L) 12/06/2022   GFRNONAA 46 (L) 08/13/2018   Takes lisinopril  Known CKD Better off hydrochlorothiazide   Eats bananas for potassium    Prediabetes Lab Results  Component Value Date   HGBA1C 6.0 12/06/2022  Stable  Tries to avoid extra sugar  Eats fruit instead   Cholesterol Lab Results  Component Value Date   CHOL 181 12/06/2022   CHOL 178 12/10/2021   CHOL 202 (H) 12/02/2020   Lab Results  Component Value Date   HDL 58.40 12/06/2022   HDL 52 12/10/2021   HDL 66.80 12/02/2020   Lab Results  Component Value Date   LDLCALC 104 (H) 12/06/2022   LDLCALC 98 12/10/2021   LDLCALC 116 (H) 12/02/2020   Lab Results  Component Value Date   TRIG 93.0 12/06/2022   TRIG 190 (H) 12/10/2021   TRIG 93.0 12/02/2020   Lab Results  Component Value Date   CHOLHDL 3 12/06/2022   CHOLHDL 3.4 12/10/2021  CHOLHDL 3 12/02/2020   No results found for: "LDLDIRECT" Fairly stable    Lab Results  Component Value Date   WBC 5.4 12/06/2022   HGB 12.3 12/06/2022   HCT 37.5 12/06/2022   MCV 92.0 12/06/2022   PLT 254.0 12/06/2022    Lab Results  Component Value Date   TSH 1.63 12/06/2022      Patient Active Problem List   Diagnosis Date Noted   Screening-pulmonary TB 12/13/2022   Estrogen deficiency 12/13/2022   Advance directive discussed with patient 12/13/2022   Lactose intolerance 12/13/2022   Prediabetes 08/07/2019   Mild cognitive impairment with memory loss 08/14/2018   CKD (chronic kidney disease), stage III (HCC) 03/09/2018   Lumbar pain 03/22/2017   Encounter for screening mammogram for breast cancer 12/29/2016   Elevated serum creatinine 06/17/2014   H/O syncope 11/22/2013   Special screening for malignant neoplasms, colon 08/23/2010   Routine general medical examination at a health care facility 08/17/2010   Hypokalemia 03/12/2009   HYPERTENSION, BENIGN ESSENTIAL  01/22/2009   Past Medical History:  Diagnosis Date   Abnormal urinalysis 06/18/2014   Elevated serum creatinine 06/17/2014   With GFR of 53    Hypertension    Hypokalemia    Inflamed skin tag 06/14/2013   Need for hepatitis C screening test 01/11/2016   Obesity 07/01/2014   Past Surgical History:  Procedure Laterality Date   CESAREAN SECTION  1987   PARTIAL HYSTERECTOMY     for cyst, has one ovary remaining (no hx of abn pap smears)   TUBAL LIGATION     Social History   Tobacco Use   Smoking status: Never   Smokeless tobacco: Never  Substance Use Topics   Alcohol use: No    Alcohol/week: 0.0 standard drinks of alcohol   Drug use: No   Family History  Problem Relation Age of Onset   Hypertension Mother    Asthma Father    COPD Father        cigars   Lung cancer Father    Breast cancer Sister    Diabetes Sister        mild   Breast cancer Sister    Other Other        ? heart problems- distant family   Mental illness Other        ? in family   No Known Allergies Current Outpatient Medications on File Prior to Visit  Medication Sig Dispense Refill   amLODipine (NORVASC) 5 MG tablet Take 1 tablet (5 mg total) by mouth daily. 90 tablet 0   fluticasone (FLONASE) 50 MCG/ACT nasal spray Place into the nose.     lisinopril (ZESTRIL) 40 MG tablet Take 1 tablet (40 mg total) by mouth daily. 90 tablet 0   No current facility-administered medications on file prior to visit.    Review of Systems  Constitutional:  Negative for activity change, appetite change, fatigue, fever and unexpected weight change.  HENT:  Negative for congestion, ear pain, rhinorrhea, sinus pressure, sore throat and trouble swallowing.   Eyes:  Negative for pain, redness, itching and visual disturbance.  Respiratory:  Negative for cough, chest tightness, shortness of breath and wheezing.   Cardiovascular:  Negative for chest pain and palpitations.  Gastrointestinal:  Negative for abdominal pain, blood  in stool, constipation, diarrhea and nausea.       Gas when she drinks milk   Endocrine: Negative for cold intolerance, heat intolerance, polydipsia and polyuria.  Genitourinary:  Negative for difficulty urinating, dysuria, frequency and urgency.  Musculoskeletal:  Positive for arthralgias. Negative for back pain, joint swelling and myalgias.  Skin:  Negative for pallor and rash.  Allergic/Immunologic: Negative for environmental allergies.  Neurological:  Negative for dizziness, tremors, syncope, weakness, numbness and headaches.  Hematological:  Negative for adenopathy. Does not bruise/bleed easily.  Psychiatric/Behavioral:  Negative for decreased concentration and dysphoric mood. The patient is not nervous/anxious.        Objective:   Physical Exam Constitutional:      General: She is not in acute distress.    Appearance: Normal appearance. She is well-developed. She is obese. She is not ill-appearing or diaphoretic.  HENT:     Head: Normocephalic and atraumatic.     Right Ear: Tympanic membrane, ear canal and external ear normal.     Left Ear: Tympanic membrane, ear canal and external ear normal.     Nose: Nose normal. No congestion.     Mouth/Throat:     Mouth: Mucous membranes are moist.     Pharynx: Oropharynx is clear. No posterior oropharyngeal erythema.  Eyes:     General: No scleral icterus.    Extraocular Movements: Extraocular movements intact.     Conjunctiva/sclera: Conjunctivae normal.     Pupils: Pupils are equal, round, and reactive to light.  Neck:     Thyroid: No thyromegaly.     Vascular: No carotid bruit or JVD.  Cardiovascular:     Rate and Rhythm: Normal rate and regular rhythm.     Pulses: Normal pulses.     Heart sounds: Normal heart sounds.     No gallop.  Pulmonary:     Effort: Pulmonary effort is normal. No respiratory distress.     Breath sounds: Normal breath sounds. No wheezing.     Comments: Good air exch Chest:     Chest wall: No  tenderness.  Abdominal:     General: Bowel sounds are normal. There is no distension or abdominal bruit.     Palpations: Abdomen is soft. There is no mass.     Tenderness: There is no abdominal tenderness.     Hernia: No hernia is present.  Genitourinary:    Comments: Breast exam: No mass, nodules, thickening, tenderness, bulging, retraction, inflamation, nipple discharge or skin changes noted.  No axillary or clavicular LA.     Musculoskeletal:        General: No tenderness. Normal range of motion.     Cervical back: Normal range of motion and neck supple. No rigidity. No muscular tenderness.     Right lower leg: No edema.     Left lower leg: No edema.     Comments: No kyphosis   Lymphadenopathy:     Cervical: No cervical adenopathy.  Skin:    General: Skin is warm and dry.     Coloration: Skin is not pale.     Findings: No erythema or rash.     Comments: Scattered lentigines and skin tags   Neurological:     Mental Status: She is alert. Mental status is at baseline.     Cranial Nerves: No cranial nerve deficit.     Motor: No abnormal muscle tone.     Coordination: Coordination normal.     Gait: Gait normal.     Deep Tendon Reflexes: Reflexes are normal and symmetric. Reflexes normal.  Psychiatric:        Mood and Affect: Mood normal.        Cognition  and Memory: Cognition and memory normal.           Assessment & Plan:   Problem List Items Addressed This Visit       Cardiovascular and Mediastinum   HYPERTENSION, BENIGN ESSENTIAL    Missed med today but has been in good control bp in fair control at this time  BP Readings from Last 1 Encounters:  12/13/22 126/82   No changes needed Most recent labs reviewed  Disc lifstyle change with low sodium diet and exercise  Plan to continue amlodipine 5 mg daily  Lisinopril 40 mg daily        Relevant Orders   Basic metabolic panel     Nervous and Auditory   Mild cognitive impairment with memory loss    Doing well  / stable to improved        Genitourinary   CKD (chronic kidney disease), stage III (HCC)    GFR of 43.8 in setting of high dose lisinopril  Encouraged good fluid intake  Blood pressure is controlled  Is prediabetic          Other   Advance directive discussed with patient    Reviewed advance directive paperwork with pt today in ref to POA Given guidance re: how to fill out with her daugher       Estrogen deficiency    Dexa ordered for screening       Relevant Orders   DG Bone Density   Hypokalemia    No longer on diuretic  No GI losses Lab Results  Component Value Date   K 3.3 (L) 12/06/2022    Unsure why low  She does try and eat high K foods Sent in klor con 10 meq daily  Re check 2 wk       Relevant Orders   Basic metabolic panel   Lactose intolerance    Recently mild gives her symptoms of gas Discussed trial of lactose free type  Handout given  Will update       Prediabetes    Lab Results  Component Value Date   HGBA1C 6.0 12/06/2022   Stable disc imp of low glycemic diet and wt loss to prevent DM2       Routine general medical examination at a health care facility - Primary    Reviewed health habits including diet and exercise and skin cancer prevention Reviewed appropriate screening tests for age  Also reviewed health mt list, fam hx and immunization status , as well as social and family history   See HPI Labs reviewed and ordered Flu shot today Plans to get Td updated at pharmacy  Declines shingrix  Reviewed advance directive today  Mammogram utd 05/2022 Colonoscopy due in about a year for screening Dexa ordered Discussed fall prevention, supplements and exercise for bone density  PHQ 0 TB gold plus for TB screening for employment today      Screening-pulmonary TB    No known exposure Needs for employment -patient sitting  TB gold ordered       Relevant Orders   QuantiFERON-TB Gold Plus   Other Visit Diagnoses     Need for  influenza vaccination       Relevant Orders   Flu Vaccine Trivalent High Dose (Fluad) (Completed)

## 2022-12-13 ENCOUNTER — Encounter: Payer: Self-pay | Admitting: Family Medicine

## 2022-12-13 ENCOUNTER — Ambulatory Visit (INDEPENDENT_AMBULATORY_CARE_PROVIDER_SITE_OTHER): Payer: Medicare PPO | Admitting: Family Medicine

## 2022-12-13 VITALS — BP 126/82 | HR 77 | Temp 98.4°F | Ht 62.5 in | Wt 167.5 lb

## 2022-12-13 DIAGNOSIS — N1831 Chronic kidney disease, stage 3a: Secondary | ICD-10-CM | POA: Diagnosis not present

## 2022-12-13 DIAGNOSIS — Z111 Encounter for screening for respiratory tuberculosis: Secondary | ICD-10-CM | POA: Diagnosis not present

## 2022-12-13 DIAGNOSIS — E2839 Other primary ovarian failure: Secondary | ICD-10-CM | POA: Diagnosis not present

## 2022-12-13 DIAGNOSIS — Z7189 Other specified counseling: Secondary | ICD-10-CM | POA: Diagnosis not present

## 2022-12-13 DIAGNOSIS — E739 Lactose intolerance, unspecified: Secondary | ICD-10-CM | POA: Insufficient documentation

## 2022-12-13 DIAGNOSIS — Z0000 Encounter for general adult medical examination without abnormal findings: Secondary | ICD-10-CM

## 2022-12-13 DIAGNOSIS — R7303 Prediabetes: Secondary | ICD-10-CM

## 2022-12-13 DIAGNOSIS — G3184 Mild cognitive impairment, so stated: Secondary | ICD-10-CM | POA: Diagnosis not present

## 2022-12-13 DIAGNOSIS — I10 Essential (primary) hypertension: Secondary | ICD-10-CM

## 2022-12-13 DIAGNOSIS — Z23 Encounter for immunization: Secondary | ICD-10-CM | POA: Diagnosis not present

## 2022-12-13 DIAGNOSIS — E876 Hypokalemia: Secondary | ICD-10-CM

## 2022-12-13 MED ORDER — POTASSIUM CHLORIDE ER 10 MEQ PO TBCR: 10.0000 meq | EXTENDED_RELEASE_TABLET | Freq: Every day | ORAL | 1 refills | Status: DC

## 2022-12-13 NOTE — Assessment & Plan Note (Signed)
Missed med today but has been in good control bp in fair control at this time  BP Readings from Last 1 Encounters:  12/13/22 126/82   No changes needed Most recent labs reviewed  Disc lifstyle change with low sodium diet and exercise  Plan to continue amlodipine 5 mg daily  Lisinopril 40 mg daily

## 2022-12-13 NOTE — Assessment & Plan Note (Signed)
No longer on diuretic  No GI losses Lab Results  Component Value Date   K 3.3 (L) 12/06/2022    Unsure why low  She does try and eat high K foods Sent in klor con 10 meq daily  Re check 2 wk

## 2022-12-13 NOTE — Assessment & Plan Note (Signed)
GFR of 43.8 in setting of high dose lisinopril  Encouraged good fluid intake  Blood pressure is controlled  Is prediabetic

## 2022-12-13 NOTE — Assessment & Plan Note (Signed)
No known exposure Needs for employment -patient sitting  TB gold ordered

## 2022-12-13 NOTE — Assessment & Plan Note (Signed)
Dexa ordered for screening

## 2022-12-13 NOTE — Assessment & Plan Note (Signed)
Reviewed health habits including diet and exercise and skin cancer prevention Reviewed appropriate screening tests for age  Also reviewed health mt list, fam hx and immunization status , as well as social and family history   See HPI Labs reviewed and ordered Flu shot today Plans to get Td updated at pharmacy  Declines shingrix  Reviewed advance directive today  Mammogram utd 05/2022 Colonoscopy due in about a year for screening Dexa ordered Discussed fall prevention, supplements and exercise for bone density  PHQ 0 TB gold plus for TB screening for employment today

## 2022-12-13 NOTE — Assessment & Plan Note (Signed)
Lab Results  Component Value Date   HGBA1C 6.0 12/06/2022   Stable disc imp of low glycemic diet and wt loss to prevent DM2

## 2022-12-13 NOTE — Assessment & Plan Note (Signed)
Doing well / stable to improved

## 2022-12-13 NOTE — Assessment & Plan Note (Signed)
Recently mild gives her symptoms of gas Discussed trial of lactose free type  Handout given  Will update

## 2022-12-13 NOTE — Patient Instructions (Addendum)
You are due for a tetanus shot- get that at the pharmacy   Flu shot today   TB screening today for your job   You may be lactose intolerant  Try a lactose free milk brand (like lactaid)   For bone health Try to get 1200-1500 mg of calcium per day with at least 2000 iu of vitamin D - for bone health If this causes constipation- take the vitamin D without calcium   Add some strength training to your routine, this is important for bone and brain health and can reduce your risk of falls and help your body use insulin properly and regulate weight  Light weights, exercise bands , and internet videos are a good way to start  Yoga (chair or regular), machines , floor exercises or a gym with machines are also good options    Start potassium 10 meq daily  Let's re check level in 2 weeks   TB blood screening test today   To prevent diabetes Try to get most of your carbohydrates from produce (with the exception of white potatoes) and whole grains Eat less bread/pasta/rice/snack foods/cereals/sweets and other items from the middle of the grocery store (processed carbs)    You have an order for:  []   2D Mammogram  []   3D Mammogram  [x]   Bone Density  (to see how bone density is as you age)   Please call to schedule it    []   [facility]  [address] [place] [address]  [phone]  []   [facility]   [address]  [place], [place] [address]  [phone]  []   [facility]      [address] [place], [place]        [phone]         []   [facility]  [address] [place], [place]  [phone]  [x]  [facility]   [address]   [place], [place] [address]  [phone]  []  [facility]  [address] [place],  [place] [address] [phone]    Make sure to wear two piece clothing  No lotions powders or deodorants the day of the appointment Make sure to bring picture ID and insurance card.  Bring list of medications you are currently taking including any supplements.   Schedule your screening mammogram through MyChart!   Select [place] imaging sites can now be scheduled through MyChart.  Log into your MyChart account.  Go to 'Visit' (or 'Appointments' if  on mobile App) --> Schedule an  Appointment  Under 'Select a Reason for Visit' choose the Mammogram  Screening option.  Complete the pre-visit questions  and select the time and place that  best fits your schedule

## 2022-12-13 NOTE — Assessment & Plan Note (Signed)
Reviewed advance directive paperwork with pt today in ref to POA Given guidance re: how to fill out with her daugher

## 2022-12-15 ENCOUNTER — Encounter: Payer: Self-pay | Admitting: *Deleted

## 2022-12-15 LAB — QUANTIFERON-TB GOLD PLUS
Mitogen-NIL: >10 [IU]/mL
NIL: 0.03 [IU]/mL
QuantiFERON-TB Gold Plus: NEGATIVE
TB1-NIL: 0.02 [IU]/mL
TB2-NIL: 0 [IU]/mL

## 2022-12-27 ENCOUNTER — Other Ambulatory Visit (INDEPENDENT_AMBULATORY_CARE_PROVIDER_SITE_OTHER): Payer: Medicare PPO

## 2022-12-27 DIAGNOSIS — I10 Essential (primary) hypertension: Secondary | ICD-10-CM

## 2022-12-27 DIAGNOSIS — E876 Hypokalemia: Secondary | ICD-10-CM

## 2022-12-28 LAB — BASIC METABOLIC PANEL
BUN: 17 mg/dL (ref 6–23)
CO2: 30 meq/L (ref 19–32)
Calcium: 9 mg/dL (ref 8.4–10.5)
Chloride: 105 meq/L (ref 96–112)
Creatinine, Ser: 1.22 mg/dL — ABNORMAL HIGH (ref 0.40–1.20)
GFR: 46 mL/min — ABNORMAL LOW (ref >60.00)
Glucose, Bld: 93 mg/dL (ref 70–99)
Potassium: 3.8 meq/L (ref 3.5–5.1)
Sodium: 143 meq/L (ref 135–145)

## 2023-01-03 ENCOUNTER — Other Ambulatory Visit: Payer: Self-pay

## 2023-01-03 DIAGNOSIS — E876 Hypokalemia: Secondary | ICD-10-CM

## 2023-01-03 DIAGNOSIS — I10 Essential (primary) hypertension: Secondary | ICD-10-CM

## 2023-01-03 MED ORDER — POTASSIUM CHLORIDE ER 10 MEQ PO TBCR: 10.0000 meq | EXTENDED_RELEASE_TABLET | Freq: Every day | ORAL | 1 refills | Status: DC

## 2023-01-06 ENCOUNTER — Ambulatory Visit (INDEPENDENT_AMBULATORY_CARE_PROVIDER_SITE_OTHER)
Admission: RE | Admit: 2023-01-06 | Discharge: 2023-01-06 | Disposition: A | Discharge status: Home / Self Care | Payer: Medicare PPO | Source: Ambulatory Visit | Attending: Family Medicine | Admitting: Family Medicine

## 2023-01-06 DIAGNOSIS — E2839 Other primary ovarian failure: Secondary | ICD-10-CM

## 2023-01-08 ENCOUNTER — Encounter: Payer: Self-pay | Admitting: Family Medicine

## 2023-01-08 DIAGNOSIS — M8580 Other specified disorders of bone density and structure, unspecified site: Secondary | ICD-10-CM | POA: Insufficient documentation

## 2023-01-09 ENCOUNTER — Encounter: Payer: Self-pay | Admitting: *Deleted

## 2023-03-03 ENCOUNTER — Other Ambulatory Visit: Payer: Self-pay | Admitting: Family Medicine

## 2023-03-03 DIAGNOSIS — I10 Essential (primary) hypertension: Secondary | ICD-10-CM

## 2023-03-03 MED ORDER — AMLODIPINE BESYLATE 5 MG PO TABS: 5.0000 mg | ORAL_TABLET | Freq: Every day | ORAL | 3 refills | Status: DC

## 2023-03-03 MED ORDER — LISINOPRIL 40 MG PO TABS: 40.0000 mg | ORAL_TABLET | Freq: Every day | ORAL | 3 refills | Status: DC

## 2023-03-03 NOTE — Telephone Encounter (Signed)
Last Fill: Lisinopril: 12/06/22     Amlodipine: 12/06/22 Last OV: 12/13/22 Next OV: 12/13/23 AWV  Routing to provider for review/authorization.

## 2023-03-03 NOTE — Telephone Encounter (Signed)
Copied from CRM [id number]. Topic: Clinical - Medication Refill >> Mar 03, 2023  9:43 AM Denese Killings wrote: Most Recent Primary Care Visit:  Provider: [facility]  Department: [facility]  Visit Type: LAB  Date: 12/27/2022  Medication: lisinopril (ZESTRIL) 40 MG tablet amLODipine (NORVASC) 5 MG tablet  Has the patient contacted their pharmacy? Yes (Agent: If no, request that the patient contact the pharmacy for the refill. If patient does not wish to contact the pharmacy document the reason why and proceed with request.) (Agent: If yes, when and what did the pharmacy advise?) Advised to call doctor to refill  Is this the correct pharmacy for this prescription? Yes If no, delete pharmacy and type the correct one.  This is the patient's preferred pharmacy:  [id number] - [place]), [place] - [address] [place] [address] Phone: [phone] Fax: [phone]   Has the prescription been filled recently? Yes  Is the patient out of the medication? No  Has the patient been seen for an appointment in the last year OR does the patient have an upcoming appointment? Yes  Can we respond through MyChart? No  Agent: Please be advised that Rx refills may take up to 3 business days. We ask that you follow-up with your pharmacy.

## 2023-03-27 ENCOUNTER — Other Ambulatory Visit: Payer: Self-pay | Admitting: Family Medicine

## 2023-03-27 DIAGNOSIS — Z1231 Encounter for screening mammogram for malignant neoplasm of breast: Secondary | ICD-10-CM

## 2023-04-07 ENCOUNTER — Other Ambulatory Visit (INDEPENDENT_AMBULATORY_CARE_PROVIDER_SITE_OTHER): Payer: Medicare HMO

## 2023-04-07 DIAGNOSIS — E876 Hypokalemia: Secondary | ICD-10-CM | POA: Diagnosis not present

## 2023-04-07 DIAGNOSIS — I10 Essential (primary) hypertension: Secondary | ICD-10-CM | POA: Diagnosis not present

## 2023-04-07 LAB — BASIC METABOLIC PANEL
BUN: 12 mg/dL (ref 6–23)
CO2: 28 meq/L (ref 19–32)
Calcium: 9.4 mg/dL (ref 8.4–10.5)
Chloride: 105 meq/L (ref 96–112)
Creatinine, Ser: 1.08 mg/dL (ref 0.40–1.20)
GFR: 53.15 mL/min — ABNORMAL LOW (ref >60.00)
Glucose, Bld: 82 mg/dL (ref 70–99)
Potassium: 3.5 meq/L (ref 3.5–5.1)
Sodium: 142 meq/L (ref 135–145)

## 2023-04-10 ENCOUNTER — Other Ambulatory Visit: Payer: Self-pay

## 2023-04-10 DIAGNOSIS — I10 Essential (primary) hypertension: Secondary | ICD-10-CM

## 2023-04-10 DIAGNOSIS — E876 Hypokalemia: Secondary | ICD-10-CM

## 2023-04-10 MED ORDER — POTASSIUM CHLORIDE ER 10 MEQ PO TBCR: 10.0000 meq | EXTENDED_RELEASE_TABLET | Freq: Every day | ORAL | 2 refills | Status: DC

## 2023-06-01 ENCOUNTER — Ambulatory Visit
Admission: RE | Admit: 2023-06-01 | Discharge: 2023-06-01 | Disposition: A | Discharge status: Home / Self Care | Payer: Medicare HMO | Source: Ambulatory Visit | Attending: Family Medicine | Admitting: Family Medicine

## 2023-06-01 DIAGNOSIS — Z1231 Encounter for screening mammogram for malignant neoplasm of breast: Secondary | ICD-10-CM | POA: Insufficient documentation

## 2023-07-06 ENCOUNTER — Other Ambulatory Visit: Payer: Self-pay | Admitting: Family Medicine

## 2023-07-06 DIAGNOSIS — I10 Essential (primary) hypertension: Secondary | ICD-10-CM

## 2023-07-06 DIAGNOSIS — E876 Hypokalemia: Secondary | ICD-10-CM

## 2023-07-06 NOTE — Telephone Encounter (Signed)
 Copied from CRM [id number]. Topic: Clinical - Medication Refill >> Jul 06, 2023 11:50 AM Alyse July wrote: Medication: potassium chloride  (KLOR-CON  10) 10 MEQ tablet...patient would like a 90 days supply.   Has the patient contacted their pharmacy? Yes   This is the patient's preferred pharmacy:  [id number] - [place] - [address] [place] ([place]) [place] [address] Phone: [phone] Fax: [phone]  Is this the correct pharmacy for this prescription? Yes If no, delete pharmacy and type the correct one.   Has the prescription been filled recently? No  Is the patient out of the medication? No  Has the patient been seen for an appointment in the last year OR does the patient have an upcoming appointment? Yes  Can we respond through MyChart? No  Agent: Please be advised that Rx refills may take up to 3 business days. We ask that you follow-up with your pharmacy.

## 2023-07-21 ENCOUNTER — Telehealth: Payer: Self-pay | Admitting: Family Medicine

## 2023-07-21 DIAGNOSIS — I10 Essential (primary) hypertension: Secondary | ICD-10-CM

## 2023-07-21 DIAGNOSIS — E876 Hypokalemia: Secondary | ICD-10-CM

## 2023-07-21 NOTE — Telephone Encounter (Signed)
 Patient dropped off letter for provider stating she is changing Pharmacy to send to [facility] fax [phone]

## 2023-07-24 MED ORDER — LISINOPRIL 40 MG PO TABS: 40.0000 mg | ORAL_TABLET | Freq: Every day | ORAL | 1 refills | Status: AC

## 2023-07-24 MED ORDER — POTASSIUM CHLORIDE ER 10 MEQ PO TBCR: 10.0000 meq | EXTENDED_RELEASE_TABLET | Freq: Every day | ORAL | 1 refills | Status: DC

## 2023-07-24 MED ORDER — AMLODIPINE BESYLATE 5 MG PO TABS: 5.0000 mg | ORAL_TABLET | Freq: Every day | ORAL | 1 refills | Status: AC

## 2023-07-24 NOTE — Telephone Encounter (Signed)
 Rx sent.

## 2023-07-24 NOTE — Addendum Note (Signed)
 Addended by: RAYLEEN GLENDORA HERO on: 07/24/2023 04:15 PM   Modules accepted: Orders

## 2023-08-30 DIAGNOSIS — H2513 Age-related nuclear cataract, bilateral: Secondary | ICD-10-CM | POA: Diagnosis not present

## 2023-11-23 ENCOUNTER — Telehealth: Payer: Self-pay | Admitting: *Deleted

## 2023-11-23 DIAGNOSIS — R195 Other fecal abnormalities: Secondary | ICD-10-CM | POA: Insufficient documentation

## 2023-11-23 NOTE — Telephone Encounter (Signed)
 We received a positive at home Fecal Immunochemical test for pt. Per PCP pt needs a colonoscopy. Pt said she was due for a colonoscopy (10 yr) in Dec already and she does want to proceed with getting it done. Pt had it done by Dr. Sankar last time and if possible we can refer her back there or if has to see GI is okay seeing them [facility]).  Pt advise we will place order and if she hasn't received call within 2 weeks to let us  know. Labs sent to scanning

## 2023-11-23 NOTE — Telephone Encounter (Signed)
 I placed GI referral    Please have her call  [facility]  [phone] To schedule   Thanks

## 2023-11-27 DIAGNOSIS — M1712 Unilateral primary osteoarthritis, left knee: Secondary | ICD-10-CM | POA: Diagnosis not present

## 2023-12-04 ENCOUNTER — Telehealth: Payer: Self-pay | Admitting: Family Medicine

## 2023-12-04 NOTE — Telephone Encounter (Signed)
 Prescription Request  12/04/2023  LOV: 12/13/2022  What is the name of the medication or equipment?  amLODipine  (NORVASC ) 5 MG tablet  lisinopril  (ZESTRIL ) 40 MG tablet  potassium chloride  (KLOR-CON  10) 10 MEQ tablet  Have you contacted your pharmacy to request a refill? No   Which pharmacy would you like this sent to?  [facility] ([facility]) - [address] [place], [place] - [address] [place] [address] Phone: [phone] Fax: [phone]   Patient notified that their request is being sent to the clinical staff for review and that they should receive a response within 2 business days.   Please advise at Mobile [phone] (mobile)

## 2023-12-04 NOTE — Telephone Encounter (Signed)
 Pt has a refill on file with [facility], just has to request it from them.  Left VM letting pt know she should still have refills with pharmacy and to check with them 1st.

## 2023-12-19 ENCOUNTER — Ambulatory Visit: Admitting: Family Medicine

## 2023-12-19 ENCOUNTER — Encounter: Payer: Self-pay | Admitting: Family Medicine

## 2023-12-19 VITALS — BP 124/78 | HR 87 | Temp 98.3°F | Ht 62.5 in | Wt 169.4 lb

## 2023-12-19 DIAGNOSIS — M8580 Other specified disorders of bone density and structure, unspecified site: Secondary | ICD-10-CM

## 2023-12-19 DIAGNOSIS — E876 Hypokalemia: Secondary | ICD-10-CM

## 2023-12-19 DIAGNOSIS — L918 Other hypertrophic disorders of the skin: Secondary | ICD-10-CM

## 2023-12-19 DIAGNOSIS — G3184 Mild cognitive impairment, so stated: Secondary | ICD-10-CM

## 2023-12-19 DIAGNOSIS — R195 Other fecal abnormalities: Secondary | ICD-10-CM

## 2023-12-19 DIAGNOSIS — N1831 Chronic kidney disease, stage 3a: Secondary | ICD-10-CM

## 2023-12-19 DIAGNOSIS — Z0000 Encounter for general adult medical examination without abnormal findings: Secondary | ICD-10-CM

## 2023-12-19 DIAGNOSIS — Z23 Encounter for immunization: Secondary | ICD-10-CM

## 2023-12-19 DIAGNOSIS — I10 Essential (primary) hypertension: Secondary | ICD-10-CM | POA: Diagnosis not present

## 2023-12-19 DIAGNOSIS — R7303 Prediabetes: Secondary | ICD-10-CM | POA: Diagnosis not present

## 2023-12-19 NOTE — Assessment & Plan Note (Signed)
 Bmet today  Encouraged good fluid intake  Has meloxicam from ortho-takes very infrequently

## 2023-12-19 NOTE — Progress Notes (Signed)
 Subjective:    Patient ID: Kristin Barton, female    DOB: March 27, 1955, 68 y.o.   MRN: [id number]  HPI  Here for health maintenance exam and to review chronic medical problems   Wt Readings from Last 3 Encounters:  12/19/23 169 lb 6 oz (76.8 kg)  12/13/22 167 lb 8 oz (76 kg)  12/12/22 155 lb (70.3 kg)   30.49 kg/m  Vitals:   12/19/23 1525  BP: 124/78  Pulse: 87  Temp: 98.3 F (36.8 C)  SpO2: 98%    Immunization History  Administered Date(s) Administered   Fluad Quad(high Dose 65+) 12/10/2021   Fluad Trivalent(High Dose 65+) 12/13/2022   INFLUENZA, HIGH DOSE SEASONAL PF 12/19/2023   PNEUMOCOCCAL CONJUGATE-20 12/10/2021   Tdap 08/23/2010, 12/14/2022    Health Maintenance Due  Topic Date Due   Medicare Annual Wellness (AWV)  12/13/2023   Colonoscopy  01/23/2024   Has been helping out in elderly care facility  Taking care of herself   Flu shot -today   Mammogram 06/2023 Self breast exam- no lumps  Family history of breast cancer   Gyn health Hysterectomy    Colon cancer screening  Colonoscopy 12/2013 normal  Had a positive at home IFOB card in October We placed GI referral -needs to call to schedule   Bone health  Dexa 01/2023-mild osteopenia  Falls-none  Fractures-none  Supplements -none   Exercise  Walking     Mood    12/19/2023    3:28 PM 12/12/2022    3:07 PM 12/03/2021    3:43 PM 12/02/2020    6:58 PM 12/02/2020    5:06 PM  Depression screen PHQ 2/9  Decreased Interest 0 0 0 0 0  Down, Depressed, Hopeless 0 0 0 0 0  PHQ - 2 Score 0 0 0 0 0  Altered sleeping 0      Tired, decreased energy 0      Change in appetite 0      Feeling bad or failure about yourself  0      Trouble concentrating 0      Moving slowly or fidgety/restless 0      Suicidal thoughts 0      PHQ-9 Score 0      Difficult doing work/chores Not difficult at all       HTN bp is stable today  No cp or palpitations or headaches or edema  No side effects to medicines   BP Readings from Last 3 Encounters:  12/19/23 124/78  12/13/22 126/82  12/10/21 (!) 134/92     Lab Results  Component Value Date   NA 142 04/07/2023   K 3.5 04/07/2023   CO2 28 04/07/2023   GLUCOSE 82 04/07/2023   BUN 12 04/07/2023   CREATININE 1.08 04/07/2023   CALCIUM 9.4 04/07/2023   GFR 53.15 (L) 04/07/2023   GFRNONAA 46 (L) 08/13/2018   Amlodipine  5 mg daily  Lisinopril  40 mg daily  Klor con for hypokalemia -pt states [facility] did not refill - still taking it daily but getting low  Has trouble getting from mail order  Almost to end of [facility] supply   CKD Due for labs   Prediabetes Lab Results  Component Value Date   HGBA1C 6.0 12/06/2022   HGBA1C 6.0 (H) 12/10/2021   HGBA1C 5.9 12/02/2020    Has left knee oa  Had fluid drawn off of it  Given meloxicam - does not take often/only if abs needed   Patient Active  Problem List   Diagnosis Date Noted   Positive colorectal cancer screening using DNA-based stool test 11/23/2023   Osteopenia 01/08/2023   Screening-pulmonary TB 12/13/2022   Estrogen deficiency 12/13/2022   Advance directive discussed with patient 12/13/2022   Lactose intolerance 12/13/2022   Prediabetes 08/07/2019   Mild cognitive impairment with memory loss 08/14/2018   CKD (chronic kidney disease), stage III (HCC) 03/09/2018   Lumbar pain 03/22/2017   Encounter for screening mammogram for breast cancer 12/29/2016   Elevated serum creatinine 06/17/2014   H/O syncope 11/22/2013   Skin tag 06/14/2013   Special screening for malignant neoplasms, colon 08/23/2010   Routine general medical examination at a health care facility 08/17/2010   Hypokalemia 03/12/2009   HYPERTENSION, BENIGN ESSENTIAL 01/22/2009   Past Medical History:  Diagnosis Date   Abnormal urinalysis 06/18/2014   Elevated serum creatinine 06/17/2014   With GFR of 53    Hypertension    Hypokalemia    Inflamed skin tag 06/14/2013   Need for hepatitis C screening test 01/11/2016    Obesity 07/01/2014   Past Surgical History:  Procedure Laterality Date   CESAREAN SECTION  1987   PARTIAL HYSTERECTOMY     for cyst, has one ovary remaining (no hx of abn pap smears)   TUBAL LIGATION     Social History   Tobacco Use   Smoking status: Never   Smokeless tobacco: Never  Substance Use Topics   Alcohol use: No    Alcohol/week: 0.0 standard drinks of alcohol   Drug use: No   Family History  Problem Relation Age of Onset   Hypertension Mother    Asthma Father    COPD Father        cigars   Lung cancer Father    Breast cancer Sister    Diabetes Sister        mild   Breast cancer Sister    Other Other        ? heart problems- distant family   Mental illness Other        ? in family   No Known Allergies Current Outpatient Medications on File Prior to Visit  Medication Sig Dispense Refill   amLODipine  (NORVASC ) 5 MG tablet Take 1 tablet (5 mg total) by mouth daily. 90 tablet 1   fluticasone (FLONASE) 50 MCG/ACT nasal spray Place into the nose.     lisinopril  (ZESTRIL ) 40 MG tablet Take 1 tablet (40 mg total) by mouth daily. 90 tablet 1   meloxicam (MOBIC) 15 MG tablet Take 15 mg by mouth daily as needed.     potassium chloride  (KLOR-CON  10) 10 MEQ tablet Take 1 tablet (10 mEq total) by mouth daily. 90 tablet 1   No current facility-administered medications on file prior to visit.    Review of Systems  Constitutional:  Negative for activity change, appetite change, fatigue, fever and unexpected weight change.  HENT:  Negative for congestion, ear pain, rhinorrhea, sinus pressure and sore throat.   Eyes:  Negative for pain, redness and visual disturbance.  Respiratory:  Negative for cough, shortness of breath and wheezing.   Cardiovascular:  Negative for chest pain and palpitations.  Gastrointestinal:  Negative for abdominal pain, blood in stool, constipation and diarrhea.  Endocrine: Negative for polydipsia and polyuria.  Genitourinary:  Negative for  dysuria, frequency and urgency.  Musculoskeletal:  Positive for arthralgias. Negative for back pain and myalgias.       Knee pain   Skin:  Negative  for pallor and rash.       Bothersome skin lesion on neck   Allergic/Immunologic: Negative for environmental allergies.  Neurological:  Negative for dizziness, syncope and headaches.  Hematological:  Negative for adenopathy. Does not bruise/bleed easily.  Psychiatric/Behavioral:  Negative for decreased concentration and dysphoric mood. The patient is not nervous/anxious.        Objective:   Physical Exam Constitutional:      General: She is not in acute distress.    Appearance: Normal appearance. She is well-developed. She is obese. She is not ill-appearing or diaphoretic.  HENT:     Head: Normocephalic and atraumatic.     Right Ear: Tympanic membrane, ear canal and external ear normal.     Left Ear: Tympanic membrane, ear canal and external ear normal.     Nose: Nose normal. No congestion.     Mouth/Throat:     Mouth: Mucous membranes are moist.     Pharynx: Oropharynx is clear. No posterior oropharyngeal erythema.  Eyes:     General: No scleral icterus.    Extraocular Movements: Extraocular movements intact.     Conjunctiva/sclera: Conjunctivae normal.     Pupils: Pupils are equal, round, and reactive to light.  Neck:     Thyroid : No thyromegaly.     Vascular: No carotid bruit or JVD.  Cardiovascular:     Rate and Rhythm: Normal rate and regular rhythm.     Pulses: Normal pulses.     Heart sounds: Normal heart sounds.     No gallop.  Pulmonary:     Effort: Pulmonary effort is normal. No respiratory distress.     Breath sounds: Normal breath sounds. No wheezing.     Comments: Good air exch Chest:     Chest wall: No tenderness.  Abdominal:     General: Bowel sounds are normal. There is no distension or abdominal bruit.     Palpations: Abdomen is soft. There is no mass.     Tenderness: There is no abdominal tenderness.      Hernia: No hernia is present.  Genitourinary:    Comments: Breast exam: No mass, nodules, thickening, tenderness, bulging, retraction, inflamation, nipple discharge or skin changes noted.  No axillary or clavicular LA.     Musculoskeletal:        General: No tenderness. Normal range of motion.     Cervical back: Normal range of motion and neck supple. No rigidity. No muscular tenderness.     Right lower leg: No edema.     Left lower leg: No edema.     Comments: No kyphosis   Lymphadenopathy:     Cervical: No cervical adenopathy.  Skin:    General: Skin is warm and dry.     Coloration: Skin is not pale.     Findings: No erythema or rash.     Comments: Pedunculated skin lesion on right neck resembling SK but connected with stalk Is irritated   Solar lentigines diffusely   Neurological:     Mental Status: She is alert. Mental status is at baseline.     Cranial Nerves: No cranial nerve deficit.     Motor: No abnormal muscle tone.     Coordination: Coordination normal.     Gait: Gait normal.     Deep Tendon Reflexes: Reflexes are normal and symmetric. Reflexes normal.  Psychiatric:        Mood and Affect: Mood normal.        Cognition and Memory: Cognition  and memory normal.           Assessment & Plan:   Problem List Items Addressed This Visit       Cardiovascular and Mediastinum   HYPERTENSION, BENIGN ESSENTIAL   Missed med today but has been in good control bp in fair control at this time  BP Readings from Last 1 Encounters:  12/19/23 124/78   No changes needed Most recent labs reviewed  Disc lifstyle change with low sodium diet and exercise  Plan to continue amlodipine  5 mg daily  Lisinopril  40 mg daily        Relevant Orders   TSH   Lipid Panel   Comprehensive metabolic panel with GFR   CBC with Differential/Platelet     Nervous and Auditory   Mild cognitive impairment with memory loss   Seems cognitively stable today  Mood is also good           Musculoskeletal and Integument   Skin tag   Right side of neck - growth resembling skin tag but with features of SK- brown / waxy and rough Becoming more bothersome  Grew back after removal in the past    Refer to dermatology for eval and removal       Osteopenia   Dexa 01/2023 No falls or fractures Discussed fall prevention, supplements and exercise for bone density  Encouraged strongly to get on ca plus D again  Handout given         Genitourinary   CKD (chronic kidney disease), stage III (HCC)   Bmet today  Encouraged good fluid intake  Has meloxicam from ortho-takes very infrequently      Relevant Orders   Comprehensive metabolic panel with GFR     Other   Routine general medical examination at a health care facility - Primary   Reviewed health habits including diet and exercise and skin cancer prevention Reviewed appropriate screening tests for age  Also reviewed health mt list, fam hx and immunization status , as well as social and family history   See HPI Labs reviewed and ordered Health Maintenance  Topic Date Due   Medicare Annual Wellness Visit  12/13/2023   Colon Cancer Screening  01/23/2024   COVID-19 Vaccine (1 - 2025-26 season) 01/03/2025*   Zoster (Shingles) Vaccine (1 of 2) 03/20/2025*   Breast Cancer Screening  05/31/2024   DTaP/Tdap/Td vaccine (3 - Td or Tdap) 12/13/2032   Pneumococcal Vaccine for age over 60  Completed   Flu Shot  Completed   DEXA scan (bone density measurement)  Completed   Hepatitis C Screening  Completed   Meningitis B Vaccine  Aged Out  *Topic was postponed. The date shown is not the original due date.    Flu shot given  Gi referral in - pt given # to call and schedule colonoscopy  No falls/fractures  Discussed fall prevention, supplements and exercise for bone density  PHQ 0 Cognition seems stable       Prediabetes   A1c today  disc imp of low glycemic diet and wt loss to prevent DM2       Relevant Orders    Hemoglobin A1c   Positive colorectal cancer screening using DNA-based stool test   Referral has gone to GI for colonoscopy Pt given number to call and schedule       Hypokalemia   Taking klor con 10 meq daily  Lab today        Relevant Orders   Comprehensive  metabolic panel with GFR   Microalbumin / creatinine urine ratio   Other Visit Diagnoses       Need for influenza vaccination       Relevant Orders   Flu vaccine HIGH DOSE PF(Fluzone Trivalent) (Completed)

## 2023-12-19 NOTE — Assessment & Plan Note (Signed)
 Taking klor con 10 meq daily  Lab today

## 2023-12-19 NOTE — Assessment & Plan Note (Signed)
 Right side of neck - growth resembling skin tag but with features of SK- brown / waxy and rough Becoming more bothersome  Grew back after removal in the past    Refer to dermatology for eval and removal

## 2023-12-19 NOTE — Assessment & Plan Note (Signed)
 Reviewed health habits including diet and exercise and skin cancer prevention Reviewed appropriate screening tests for age  Also reviewed health mt list, fam hx and immunization status , as well as social and family history   See HPI Labs reviewed and ordered Health Maintenance  Topic Date Due   Medicare Annual Wellness Visit  12/13/2023   Colon Cancer Screening  01/23/2024   COVID-19 Vaccine (1 - 2025-26 season) 01/03/2025*   Zoster (Shingles) Vaccine (1 of 2) 03/20/2025*   Breast Cancer Screening  05/31/2024   DTaP/Tdap/Td vaccine (3 - Td or Tdap) 12/13/2032   Pneumococcal Vaccine for age over 73  Completed   Flu Shot  Completed   DEXA scan (bone density measurement)  Completed   Hepatitis C Screening  Completed   Meningitis B Vaccine  Aged Out  *Topic was postponed. The date shown is not the original due date.    Flu shot given  Gi referral in - pt given # to call and schedule colonoscopy  No falls/fractures  Discussed fall prevention, supplements and exercise for bone density  PHQ 0 Cognition seems stable

## 2023-12-19 NOTE — Assessment & Plan Note (Signed)
 Missed med today but has been in good control bp in fair control at this time  BP Readings from Last 1 Encounters:  12/19/23 124/78   No changes needed Most recent labs reviewed  Disc lifstyle change with low sodium diet and exercise  Plan to continue amlodipine  5 mg daily  Lisinopril  40 mg daily

## 2023-12-19 NOTE — Assessment & Plan Note (Signed)
 Dexa 01/2023 No falls or fractures Discussed fall prevention, supplements and exercise for bone density  Encouraged strongly to get on ca plus D again  Handout given

## 2023-12-19 NOTE — Patient Instructions (Addendum)
 Please call and schedule your colonoscopy    Gastroenterology  [phone]  Keep walking  Add some strength training to your routine, this is important for bone and brain health and can reduce your risk of falls and help your body use insulin properly and regulate weight  Light weights, exercise bands , and internet videos are a good way to start  Yoga (chair or regular), machines , floor exercises or a gym with machines are also good options    Focus on upper body and core  There are programs on u tube on line You can also buy videos on DVD  For bone health Try to get 1200-1500 mg of calcium per day with at least 2000 iu of vitamin D - for bone health  If that causes any GI side effects (trouble with bowel movements) then just take the vitamin D without the calcium  Flu shot today   Labs today   I put the referral in for dermatology for the mole on your neck  Please let us  know if you don't hear in 1-2 weeks to set that up (mychart message or call or letter)

## 2023-12-19 NOTE — Assessment & Plan Note (Signed)
 A1c today  disc imp of low glycemic diet and wt loss to prevent DM2

## 2023-12-19 NOTE — Assessment & Plan Note (Signed)
 Referral has gone to GI for colonoscopy Pt given number to call and schedule

## 2023-12-19 NOTE — Assessment & Plan Note (Signed)
 Seems cognitively stable today  Mood is also good

## 2023-12-20 ENCOUNTER — Ambulatory Visit: Payer: Self-pay | Admitting: Family Medicine

## 2023-12-20 ENCOUNTER — Encounter: Payer: Self-pay | Admitting: Gastroenterology

## 2023-12-20 LAB — CBC WITH DIFFERENTIAL/PLATELET
Basophils Absolute: 0 K/uL (ref 0.0–0.1)
Basophils Relative: 0.5 % (ref 0.0–3.0)
Eosinophils Absolute: 0.1 K/uL (ref 0.0–0.7)
Eosinophils Relative: 1.6 % (ref 0.0–5.0)
HCT: 39 % (ref 36.0–46.0)
Hemoglobin: 13.1 g/dL (ref 12.0–15.0)
Lymphocytes Relative: 29.9 % (ref 12.0–46.0)
Lymphs Abs: 1.6 K/uL (ref 0.7–4.0)
MCHC: 33.7 g/dL (ref 30.0–36.0)
MCV: 92.5 fl (ref 78.0–100.0)
Monocytes Absolute: 0.4 K/uL (ref 0.1–1.0)
Monocytes Relative: 6.6 % (ref 3.0–12.0)
Neutro Abs: 3.3 K/uL (ref 1.4–7.7)
Neutrophils Relative %: 61.4 % (ref 43.0–77.0)
Platelets: 247 K/uL (ref 150.0–400.0)
RBC: 4.22 Mil/uL (ref 3.87–5.11)
RDW: 13.9 % (ref 11.5–15.5)
WBC: 5.3 K/uL (ref 4.0–10.5)

## 2023-12-20 LAB — LIPID PANEL
Cholesterol: 201 mg/dL — ABNORMAL HIGH (ref 0–200)
HDL: 71.5 mg/dL (ref >39.00)
LDL Cholesterol: 121 mg/dL — ABNORMAL HIGH (ref 0–99)
NonHDL: 129.39
Total CHOL/HDL Ratio: 3
Triglycerides: 42 mg/dL (ref 0.0–149.0)
VLDL: 8.4 mg/dL (ref 0.0–40.0)

## 2023-12-20 LAB — COMPREHENSIVE METABOLIC PANEL WITH GFR
ALT: 11 U/L (ref 0–35)
AST: 17 U/L (ref 0–37)
Albumin: 4.4 g/dL (ref 3.5–5.2)
Alkaline Phosphatase: 53 U/L (ref 39–117)
BUN: 16 mg/dL (ref 6–23)
CO2: 30 meq/L (ref 19–32)
Calcium: 9.3 mg/dL (ref 8.4–10.5)
Chloride: 105 meq/L (ref 96–112)
Creatinine, Ser: 1.11 mg/dL (ref 0.40–1.20)
GFR: 51.17 mL/min — ABNORMAL LOW (ref >60.00)
Glucose, Bld: 85 mg/dL (ref 70–99)
Potassium: 3.9 meq/L (ref 3.5–5.1)
Sodium: 143 meq/L (ref 135–145)
Total Bilirubin: 0.4 mg/dL (ref 0.2–1.2)
Total Protein: 7.2 g/dL (ref 6.0–8.3)

## 2023-12-20 LAB — HEMOGLOBIN A1C: Hgb A1c MFr Bld: 6.1 % (ref 4.6–6.5)

## 2023-12-20 LAB — MICROALBUMIN / CREATININE URINE RATIO
Creatinine,U: 186.8 mg/dL
Microalb Creat Ratio: 13.6 mg/g (ref 0.0–30.0)
Microalb, Ur: 2.5 mg/dL — ABNORMAL HIGH (ref 0.0–1.9)

## 2023-12-20 LAB — TSH: TSH: 1.91 u[IU]/mL (ref 0.35–5.50)

## 2023-12-21 MED ORDER — POTASSIUM CHLORIDE ER 10 MEQ PO TBCR: 10.0000 meq | EXTENDED_RELEASE_TABLET | Freq: Every day | ORAL | 3 refills | Status: AC

## 2023-12-21 NOTE — Addendum Note (Signed)
 Addended by: RAYLEEN GLENDORA HERO on: 12/21/2023 03:28 PM   Modules accepted: Orders

## 2024-01-08 ENCOUNTER — Ambulatory Visit

## 2024-01-08 ENCOUNTER — Other Ambulatory Visit: Payer: Self-pay

## 2024-01-08 VITALS — Ht 62.0 in | Wt 169.0 lb

## 2024-01-08 DIAGNOSIS — Z1211 Encounter for screening for malignant neoplasm of colon: Secondary | ICD-10-CM

## 2024-01-08 MED ORDER — NA SULFATE-K SULFATE-MG SULF 17.5-3.13-1.6 GM/177ML PO SOLN: 1.0000 | Freq: Once | ORAL | 0 refills | Status: AC

## 2024-01-08 NOTE — Progress Notes (Signed)
 Denies allergies to eggs or soy products. Denies complication of anesthesia or sedation. Denies use of weight loss medication. Denies use of O2.   Emmi instructions given for colonoscopy.   During PV the patients daughter was present for the visit. Her daughter said that she would help her with her prep and the instructions.

## 2024-01-18 ENCOUNTER — Encounter: Payer: Self-pay | Admitting: Gastroenterology

## 2024-01-22 ENCOUNTER — Encounter: Payer: Self-pay | Admitting: Gastroenterology

## 2024-01-22 ENCOUNTER — Ambulatory Visit: Admitting: Gastroenterology

## 2024-01-22 VITALS — BP 115/70 | HR 88 | Temp 98.4°F | Resp 16 | Ht 62.5 in | Wt 169.0 lb

## 2024-01-22 DIAGNOSIS — Z1211 Encounter for screening for malignant neoplasm of colon: Secondary | ICD-10-CM | POA: Diagnosis not present

## 2024-01-22 DIAGNOSIS — K5140 Inflammatory polyps of colon without complications: Secondary | ICD-10-CM

## 2024-01-22 DIAGNOSIS — D122 Benign neoplasm of ascending colon: Secondary | ICD-10-CM

## 2024-01-22 DIAGNOSIS — K635 Polyp of colon: Secondary | ICD-10-CM | POA: Diagnosis not present

## 2024-01-22 DIAGNOSIS — K5730 Diverticulosis of large intestine without perforation or abscess without bleeding: Secondary | ICD-10-CM

## 2024-01-22 MED ORDER — SODIUM CHLORIDE 0.9 % IV SOLN: 500.0000 mL | Freq: Once | INTRAVENOUS | Status: DC

## 2024-01-22 NOTE — Progress Notes (Unsigned)
 Called to room to assist during endoscopic procedure.  Patient ID and intended procedure confirmed with present staff. Received instructions for my participation in the procedure from the performing physician.

## 2024-01-22 NOTE — Progress Notes (Unsigned)
 Pt's states no medical or surgical changes since previsit or office visit.

## 2024-01-22 NOTE — Progress Notes (Unsigned)
 Sedate, gd SR, tolerated procedure well, VSS, report to RN

## 2024-01-22 NOTE — Patient Instructions (Addendum)
Resume previous diet. Continue present medications. Await pathology results. Repeat colonoscopy (date not yet determined) for surveillance based on pathology results.  YOU HAD AN ENDOSCOPIC PROCEDURE TODAY AT THE [facility]:   Refer to the procedure report that was given to you for any specific questions about what was found during the examination.  If the procedure report does not answer your questions, please call your gastroenterologist to clarify.  If you requested that your care partner not be given the details of your procedure findings, then the procedure report has been included in a sealed envelope for you to review at your convenience later.  YOU SHOULD EXPECT: Some feelings of bloating in the abdomen. Passage of more gas than usual.  Walking can help get rid of the air that was put into your GI tract during the procedure and reduce the bloating. If you had a lower endoscopy (such as a colonoscopy or flexible sigmoidoscopy) you may notice spotting of blood in your stool or on the toilet paper. If you underwent a bowel prep for your procedure, you may not have a normal bowel movement for a few days.  Please Note:  You might notice some irritation and congestion in your nose or some drainage.  This is from the oxygen used during your procedure.  There is no need for concern and it should clear up in a day or so.  SYMPTOMS TO REPORT IMMEDIATELY:  Following lower endoscopy (colonoscopy or flexible sigmoidoscopy):  Excessive amounts of blood in the stool  Significant tenderness or worsening of abdominal pains  Swelling of the abdomen that is new, acute  Fever of 100F or higher  For urgent or emergent issues, a gastroenterologist can be reached at any hour by calling [phone]. Do not use MyChart messaging for urgent concerns.    DIET:  We do recommend a small meal at first, but then you may proceed to your regular diet.  Drink plenty of fluids but you should avoid  alcoholic beverages for 24 hours.  ACTIVITY:  You should plan to take it easy for the rest of today and you should NOT DRIVE or use heavy machinery until tomorrow (because of the sedation medicines used during the test).    FOLLOW UP: Our staff will call the number listed on your records the next business day following your procedure.  We will call around 7:15- 8:00 am to check on you and address any questions or concerns that you may have regarding the information given to you following your procedure. If we do not reach you, we will leave a message.     If any biopsies were taken you will be contacted by phone or by letter within the next 1-3 weeks.  Please call us at [phone] if you have not heard about the biopsies in 3 weeks.    SIGNATURES/CONFIDENTIALITY: You and/or your care partner have signed paperwork which will be entered into your electronic medical record.  These signatures attest to the fact that that the information above on your After Visit Summary has been reviewed and is understood.  Full responsibility of the confidentiality of this discharge information lies with you and/or your care-partner. 

## 2024-01-22 NOTE — Progress Notes (Unsigned)
 [facility] History and Physical   Primary Care Physician:  Tower, Laine LABOR, MD   Reason for Procedure:   Colon cancer screening  Plan:    Screening colonoscopy   HPI: Kristin Barton is a 68 y.o. female undergoing average risk screening colonoscopy.  She has no family history of colon cancer and no chronic GI symptoms. She had a normal colonoscopy at [facility] in 2015.  The patient was provided an opportunity to ask questions and all were answered. The patient agreed with the plan    Past Medical History:  Diagnosis Date   Abnormal urinalysis 06/18/2014   Arthritis    Elevated serum creatinine 06/17/2014   With GFR of 53    Hypertension    Hypokalemia    Inflamed skin tag 06/14/2013   Need for hepatitis C screening test 01/11/2016   Obesity 07/01/2014    Past Surgical History:  Procedure Laterality Date   CESAREAN SECTION  01/31/1985   COLONOSCOPY  01/22/2014   PARTIAL HYSTERECTOMY     for cyst, has one ovary remaining (no hx of abn pap smears)   TUBAL LIGATION      Prior to Admission medications  Medication Sig Start Date End Date Taking? Authorizing Provider  amLODipine  (NORVASC ) 5 MG tablet Take 1 tablet (5 mg total) by mouth daily. 07/24/23  Yes Tower, Laine LABOR, MD  lisinopril  (ZESTRIL ) 40 MG tablet Take 1 tablet (40 mg total) by mouth daily. 07/24/23  Yes Tower, Laine LABOR, MD  potassium chloride  (KLOR-CON  10) 10 MEQ tablet Take 1 tablet (10 mEq total) by mouth daily. 12/21/23  Yes Tower, Laine LABOR, MD  fluticasone (FLONASE) 50 MCG/ACT nasal spray Place into the nose. Patient not taking: Reported on 01/08/2024 06/25/21   [provider]  meloxicam (MOBIC) 15 MG tablet Take 15 mg by mouth daily as needed. Patient not taking: Reported on 01/08/2024 11/27/23   [provider]    Current Outpatient Medications  Medication Sig Dispense Refill   amLODipine  (NORVASC ) 5 MG tablet Take 1 tablet (5 mg total) by mouth daily. 90 tablet 1   lisinopril   (ZESTRIL ) 40 MG tablet Take 1 tablet (40 mg total) by mouth daily. 90 tablet 1   potassium chloride  (KLOR-CON  10) 10 MEQ tablet Take 1 tablet (10 mEq total) by mouth daily. 90 tablet 3   fluticasone (FLONASE) 50 MCG/ACT nasal spray Place into the nose. (Patient not taking: Reported on 01/08/2024)     meloxicam (MOBIC) 15 MG tablet Take 15 mg by mouth daily as needed. (Patient not taking: Reported on 01/08/2024)     Current Facility-Administered Medications  Medication Dose Route Frequency Provider Last Rate Last Admin   0.9 %  sodium chloride  infusion  500 mL Intravenous Once Melisia Leming E, MD        Allergies as of 01/22/2024   (No Known Allergies)    Family History  Problem Relation Age of Onset   Hypertension Mother    Asthma Father    COPD Father        cigars   Lung cancer Father    Breast cancer Sister    Diabetes Sister        mild   Breast cancer Sister    Other Other        ? heart problems- distant family   Mental illness Other        ? in family   Colon cancer Neg Hx    Esophageal cancer Neg Hx  Rectal cancer Neg Hx    Stomach cancer Neg Hx     Social History   Socioeconomic History   Marital status: Divorced    Spouse name: Not on file   Number of children: 1   Years of education: 12   Highest education level: Not on file  Occupational History   Occupation: Manufacturing Engineer co    Employer: [employer]    Comment: 3rd shift  Tobacco Use   Smoking status: Never   Smokeless tobacco: Never  Substance and Sexual Activity   Alcohol use: No    Alcohol/week: 0.0 standard drinks of alcohol   Drug use: No   Sexual activity: Not on file  Other Topics Concern   Not on file  Social History Narrative   Regular exercise: walking   Cares for new grandchild         Social Drivers of Health   Tobacco Use: Low Risk (01/22/2024)   Patient History    Smoking Tobacco Use: Never    Smokeless Tobacco Use: Never    Passive Exposure: Not on file   Financial Resource Strain: Low Risk (12/12/2022)   Overall Financial Resource Strain (CARDIA)    Difficulty of Paying Living Expenses: Not hard at all  Food Insecurity: No Food Insecurity (12/12/2022)   Hunger Vital Sign    Worried About Running Out of Food in the Last Year: Never true    Ran Out of Food in the Last Year: Never true  Transportation Needs: No Transportation Needs (12/12/2022)   PRAPARE - Administrator, Civil Service (Medical): No    Lack of Transportation (Non-Medical): No  Physical Activity: Insufficiently Active (12/12/2022)   Exercise Vital Sign    Days of Exercise per Week: 3 days    Minutes of Exercise per Session: 30 min  Stress: No Stress Concern Present (12/12/2022)   [employer] of Occupational Health - Occupational Stress Questionnaire    Feeling of Stress : Not at all  Social Connections: Socially Isolated (12/12/2022)   Social Connection and Isolation Panel    Frequency of Communication with Friends and Family: More than three times a week    Frequency of Social Gatherings with Friends and Family: More than three times a week    Attends Religious Services: Never    Database Administrator or Organizations: No    Attends Banker Meetings: Never    Marital Status: Divorced  Catering Manager Violence: Not At Risk (12/12/2022)   Humiliation, Afraid, Rape, and Kick questionnaire    Fear of Current or Ex-Partner: No    Emotionally Abused: No    Physically Abused: No    Sexually Abused: No  Depression (PHQ2-9): Low Risk (12/19/2023)   Depression (PHQ2-9)    PHQ-2 Score: 0  Alcohol Screen: Low Risk (12/12/2022)   Alcohol Screen    Last Alcohol Screening Score (AUDIT): 0  Housing: Low Risk (12/12/2022)   Housing    Last Housing Risk Score: 0  Utilities: Not At Risk (12/12/2022)   AHC Utilities    Threatened with loss of utilities: No  Health Literacy: Adequate Health Literacy (12/12/2022)   B1300 Health Literacy     Frequency of need for help with medical instructions: Never    Review of Systems:  All other review of systems negative except as mentioned in the HPI.  Physical Exam: Vital signs BP (!) 143/96   Pulse 87   Temp 98.4 F (36.9 C)   Ht 5'  2.5 (1.588 m)   Wt 169 lb (76.7 kg)   SpO2 97%   BMI 30.42 kg/m   General:   Alert,  Well-developed, well-nourished, pleasant and cooperative in NAD Airway:  Mallampati 2 Lungs:  Clear throughout to auscultation.   Heart:  Regular rate and rhythm; no murmurs, clicks, rubs,  or gallops. Abdomen:  Soft, nontender and nondistended. Normal bowel sounds.   Neuro/Psych:  Normal mood and affect. A and O x 3   Flint Hakeem E. Stacia, MD [facility]

## 2024-01-22 NOTE — Op Note (Signed)
 [facility] Patient Name: Kristin Barton Procedure Date: 01/22/2024 3:31 PM MRN: [id number] Endoscopist: Glendia E. Stacia , MD, [id number] Age: 68 Referring MD:  Date of Birth: 05-17-1955 Gender: Female Account #: [id number] Procedure:                Colonoscopy Indications:              Screening for colorectal malignant neoplasm (last                            colonoscopy was 10 years ago) Medicines:                Monitored Anesthesia Care Procedure:                Pre-Anesthesia Assessment:                           - Prior to the procedure, a History and Physical                            was performed, and patient medications and                            allergies were reviewed. The patient's tolerance of                            previous anesthesia was also reviewed. The risks                            and benefits of the procedure and the sedation                            options and risks were discussed with the patient.                            All questions were answered, and informed consent                            was obtained. Prior Anticoagulants: The patient has                            taken no anticoagulant or antiplatelet agents. ASA                            Grade Assessment: II - A patient with mild systemic                            disease. After reviewing the risks and benefits,                            the patient was deemed in satisfactory condition to                            undergo the procedure.  After obtaining informed consent, the colonoscope                            was passed under direct vision. Throughout the                            procedure, the patient's blood pressure, pulse, and                            oxygen saturations were monitored continuously. The                            [id number]-HQ190L Colonoscope [id number] was introduced                            through the anus and  advanced to the the cecum,                            identified by appendiceal orifice and ileocecal                            valve. The colonoscopy was performed without                            difficulty. The patient tolerated the procedure                            well. The quality of the bowel preparation was                            good. The ileocecal valve, appendiceal orifice, and                            rectum were photographed. The bowel preparation                            used was SUPREP via split dose instruction. Scope In: 3:50:43 PM Scope Out: 4:22:09 PM Scope Withdrawal Time: 0 hours 26 minutes 0 seconds  Total Procedure Duration: 0 hours 31 minutes 26 seconds  Findings:                 The perianal and digital rectal examinations were                            normal. Pertinent negatives include normal                            sphincter tone and no palpable rectal lesions.                           A 28 mm polyp was found in the proximal ascending                            colon. The polyp was sessile. The polyp was  removed                            with a saline injection-lift technique using a hot                            snare. After the initial polypectomy, a small                            fragment of polyp was remaining at the based. This                            was removed with a second pass with the hot snare.                            Resection and retrieval were complete. Estimated                            blood loss: none. To prevent bleeding after the                            polypectomy, two hemostatic clips were successfully                            placed (MR conditional). Clip manufacturer:                            [employer]. There was no bleeding at the end of the                            procedure.                           Two sessile polyps were found in the sigmoid colon.                            The polyps were  2 to 3 mm in size. These polyps                            were removed with a cold snare. Resection and                            retrieval were complete. Estimated blood loss was                            minimal.                           Multiple medium-mouthed and small-mouthed                            diverticula were found in the sigmoid colon,  transverse colon and ascending colon.                           The exam was otherwise normal throughout the                            examined colon.                           The retroflexed view of the distal rectum and anal                            verge was normal and showed no anal or rectal                            abnormalities. Complications:            No immediate complications. Estimated Blood Loss:     Estimated blood loss was minimal. Impression:               - One 28 mm polyp in the proximal ascending colon,                            removed using injection-lift and a hot snare.                            Resected and retrieved. Clips (MR conditional) were                            placed.                           - Two 2 to 3 mm polyps in the sigmoid colon,                            removed with a cold snare. Resected and retrieved.                           - Moderate diverticulosis in the sigmoid colon, in                            the transverse colon and in the ascending colon.                           - The distal rectum and anal verge are normal on                            retroflexion view. Recommendation:           - Patient has a contact number available for                            emergencies. The signs and symptoms of potential  delayed complications were discussed with the                            patient. Return to normal activities tomorrow.                            Written discharge instructions were provided to the                             patient.                           - Resume previous diet.                           - Continue present medications.                           - Await pathology results.                           - Repeat colonoscopy (date not yet determined) for                            surveillance based on pathology results. Rasheeda Mulvehill E. Stacia, MD 01/22/2024 4:31:32 PM This report has been signed electronically.

## 2024-01-23 ENCOUNTER — Telehealth: Payer: Self-pay

## 2024-01-23 NOTE — Telephone Encounter (Signed)
" °  Follow up Call-     01/22/2024    3:12 PM  Call back number  Post procedure Call Back phone  # [phone]  Permission to leave phone message Yes     Patient questions:  Do you have a fever, pain , or abdominal swelling? No. Pain Score  0 *  Have you tolerated food without any problems? Yes.    Have you been able to return to your normal activities? Yes.    Do you have any questions about your discharge instructions: Diet   No. Medications  No. Follow up visit  No.  Do you have questions or concerns about your Care? No.  Actions: * If pain score is 4 or above: No action needed, pain <4.   "

## 2024-01-29 LAB — SURGICAL PATHOLOGY

## 2024-01-31 ENCOUNTER — Ambulatory Visit: Payer: Self-pay | Admitting: Gastroenterology

## 2024-03-01 ENCOUNTER — Ambulatory Visit

## 2024-07-11 ENCOUNTER — Ambulatory Visit

## 2024-08-06 ENCOUNTER — Ambulatory Visit: Admitting: Physician Assistant
